# Patient Record
Sex: Male | Born: 1995 | Race: White | Hispanic: No | Marital: Single | Smoking: Current every day smoker
Health system: Southern US, Community
[De-identification: ages and names within clinical notes are randomized; demographics above are authoritative.]

## PROBLEM LIST (undated history)

## (undated) DIAGNOSIS — N35014 Post-traumatic urethral stricture, male, unspecified: Secondary | ICD-10-CM

## (undated) DIAGNOSIS — I10 Essential (primary) hypertension: Secondary | ICD-10-CM

## (undated) DIAGNOSIS — N261 Atrophy of kidney (terminal): Secondary | ICD-10-CM

## (undated) DIAGNOSIS — E669 Obesity, unspecified: Secondary | ICD-10-CM

## (undated) DIAGNOSIS — N189 Chronic kidney disease, unspecified: Secondary | ICD-10-CM

## (undated) DIAGNOSIS — N360 Urethral fistula: Secondary | ICD-10-CM

## (undated) HISTORY — PX: EPISPADIUS CORRECTION: SHX624

## (undated) HISTORY — PX: TRANSRECTAL DRAINAGE OF PELVIC ABSCESS: SUR1387

## (undated) HISTORY — PX: OTHER SURGICAL HISTORY: SHX169

## (undated) HISTORY — DX: Chronic kidney disease, unspecified: N18.9

## (undated) HISTORY — DX: Post-traumatic urethral stricture, male, unspecified: N35.014

## (undated) HISTORY — PX: ABDOMINAL SURGERY: SHX537

## (undated) HISTORY — PX: URETHRAL DILATION: SUR417

## (undated) HISTORY — DX: Atrophy of kidney (terminal): N26.1

## (undated) HISTORY — DX: Obesity, unspecified: E66.9

## (undated) HISTORY — DX: Urethral fistula: N36.0

## (undated) HISTORY — PX: CYSTOURETHROSCOPY: SHX476

---
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
## 2017-12-05 DIAGNOSIS — N181 Chronic kidney disease, stage 1: Secondary | ICD-10-CM | POA: Insufficient documentation

## 2018-02-01 ENCOUNTER — Encounter: Payer: Self-pay | Admitting: Emergency Medicine

## 2018-02-01 ENCOUNTER — Other Ambulatory Visit: Payer: Self-pay

## 2018-02-01 ENCOUNTER — Emergency Department
Admission: EM | Admit: 2018-02-01 | Discharge: 2018-02-01 | Disposition: A | Discharge status: Home / Self Care | Payer: Medicaid Other | Attending: Emergency Medicine | Admitting: Emergency Medicine

## 2018-02-01 DIAGNOSIS — J111 Influenza due to unidentified influenza virus with other respiratory manifestations: Secondary | ICD-10-CM

## 2018-02-01 DIAGNOSIS — R05 Cough: Secondary | ICD-10-CM | POA: Diagnosis present

## 2018-02-01 DIAGNOSIS — I10 Essential (primary) hypertension: Secondary | ICD-10-CM | POA: Diagnosis not present

## 2018-02-01 DIAGNOSIS — J101 Influenza due to other identified influenza virus with other respiratory manifestations: Secondary | ICD-10-CM | POA: Diagnosis not present

## 2018-02-01 LAB — INFLUENZA PANEL BY PCR (TYPE A & B)
INFLAPCR: POSITIVE — AB
INFLBPCR: NEGATIVE

## 2018-02-01 MED ORDER — SODIUM CHLORIDE 0.9 % IV BOLUS (SEPSIS)
1000.0000 mL | Freq: Once | INTRAVENOUS | Status: AC
  Administered 2018-02-01: 1000 mL via INTRAVENOUS

## 2018-02-01 NOTE — Discharge Instructions (Signed)
You are being treated for influenza A.  Drink plenty fluids.  Return to the emergency room immediately for any worsening condition including trouble breathing, passing out, confusion or altered mental status, shortness of breath, concern for dehydration such as dry mouth or not making urine, abdominal pain, or any other symptoms concerning to you.

## 2018-02-01 NOTE — ED Provider Notes (Signed)
Flu A positive.  Feels improved after fluids.  I appreciate he still tachycardic but he is asking to go home.  No evidence of pneumonia.  Declines Tamiflu.  Discharged home according to Dr. Merlene PullingLord's plan.   Merrily Brittleifenbark, Leara Rawl, MD 02/01/18 [phone]

## 2018-02-01 NOTE — ED Provider Notes (Signed)
[facility] Emergency Department Provider Note ____________________________________________   I have reviewed the triage vital signs and the triage nursing note.  HISTORY  Chief Complaint Influenza   Historian Patient  HPI Austin Stevenson is a 22 y.o. male presents with 24 hours of cough congestion, mild nausea with one episode of nonbloody nonbilious emesis, and family contacts with positive for the flu.  Positive fever at home.  He took ibuprofen.  No abdominal pain.  Symptoms are moderate.  Nothing is making it worse or better.   Past Medical History:  Diagnosis Date  . Hypertension     Patient Active Problem List   Diagnosis Date Noted  . Food impaction of esophagus     Past Surgical History:  Procedure Laterality Date  . ABDOMINAL SURGERY    . ESOPHAGOGASTRODUODENOSCOPY (EGD) WITH PROPOFOL N/A 11/25/2016   Procedure: ESOPHAGOGASTRODUODENOSCOPY (EGD) WITH PROPOFOL;  Surgeon: Midge Minium, MD;  Location: [facility] ENDOSCOPY;  Service: Endoscopy;  Laterality: N/A;    Prior to Admission medications   Medication Sig Start Date End Date Taking? Authorizing Provider  lisinopril (PRINIVIL,ZESTRIL) 10 MG tablet Take 15 tablets by mouth daily.  08/02/16  Yes [provider]    No Known Allergies  No family history on file.  Social History Social History   Tobacco Use  . Smoking status: Never Smoker  . Smokeless tobacco: Never Used  Substance Use Topics  . Alcohol use: No  . Drug use: No    Review of Systems  Constitutional: Positive for fever. Eyes: Negative for visual changes. ENT: Negative for sore throat. Cardiovascular: Negative for chest pain. Respiratory: Defer congestion without pleuritic chest pain or shortness of breath.   Gastrointestinal: Negative for abdominal pain. Genitourinary: Negative for dysuria. Musculoskeletal: Negative for back pain. Skin: Negative for rash. Neurological: Negative for  headache.  ____________________________________________   PHYSICAL EXAM:  VITAL SIGNS: ED Triage Vitals  Enc Vitals Group     BP 02/01/18 1119 (!) 159/77     Pulse Rate 02/01/18 1119 (!) 141     Resp 02/01/18 1119 20     Temp 02/01/18 1119 98.1 F (36.7 C)     Temp Source 02/01/18 1119 Oral     SpO2 02/01/18 1119 97 %     Weight 02/01/18 1120 (!) 315 lb (142.9 kg)     Height 02/01/18 1120 6\' 1"  (1.854 m)     Head Circumference --      Peak Flow --      Pain Score 02/01/18 1120 0     Pain Loc --      Pain Edu? --      Excl. in GC? --      Constitutional: Alert and oriented.  No acute distress, but does look like he feels miserable. HEENT   Head: Normocephalic and atraumatic.      Eyes: Conjunctivae are normal. Pupils equal and round.       Ears:         Nose: No congestion/rhinnorhea.   Mouth/Throat: Mucous membranes are moist.   Neck: No stridor. Cardiovascular/Chest: Tachycardic rate, regular rhythm.  No murmurs, rubs, or gallops. Respiratory: Normal respiratory effort without tachypnea nor retractions. Breath sounds are clear and equal bilaterally. No wheezes/rales/rhonchi. Gastrointestinal: Soft. No distention, no guarding, no rebound. Nontender.  Obese Genitourinary/rectal:Deferred Musculoskeletal: Nontender with normal range of motion in all extremities. No joint effusions.  No lower extremity tenderness.  No edema. Neurologic:  Normal speech and language. No gross or focal  neurologic deficits are appreciated. Skin:  Skin is warm, dry and intact. No rash noted. Psychiatric: Mood and affect are normal. Speech and behavior are normal. Patient exhibits appropriate insight and judgment.   ____________________________________________  LABS (pertinent positives/negatives) I, Governor Rooksebecca Eastyn Dattilo, MD the attending physician have reviewed the labs noted below.  Labs Reviewed  INFLUENZA PANEL BY PCR (TYPE A & B) - Abnormal; Notable for the following components:       Result Value   Influenza A By PCR POSITIVE (*)    All other components within normal limits    __________________________________________  PROCEDURES  Procedure(s) performed: None  Critical Care performed: None   ____________________________________________  ED COURSE / ASSESSMENT AND PLAN  Pertinent labs & imaging results that were available during my care of the patient were reviewed by me and considered in my medical decision making (see chart for details).   Patient has tachycardia and borderline blood pressure, I am going to give him IV fluids here in the emergency department.  Otherwise he is not hypoxic or having significant trouble breathing.  Patient found to be influenza A positive, he has had positive sick contacts.  I am been given fluid bolus here 1-2 L.  We discussed the risk and benefit of Tamiflu, and given that he is young and otherwise healthy, and chose to hold off on any use of Tamiflu at this point.  Patient care to be transferred to Dr. Lamont Snowballifenbark at shift change 3 PM.  Disposition expected to be home after fluid bolus.  CONSULTATIONS: None  Patient / Family / Caregiver informed of clinical course, medical decision-making process, and agree with plan.   I discussed return precautions, follow-up instructions, and discharge instructions with patient and/or family.  Discharge Instructions: You are being treated for influenza A.  Drink plenty fluids.  Return to the emergency room immediately for any worsening condition including trouble breathing, passing out, confusion or altered mental status, shortness of breath, concern for dehydration such as dry mouth or not making urine, abdominal pain, or any other symptoms concerning to you.  ___________________________________________   FINAL CLINICAL IMPRESSION(S) / ED DIAGNOSES   Final diagnoses:  Influenza      ___________________________________________        Note: This dictation was  prepared with Dragon dictation. Any transcriptional errors that result from this process are unintentional    Governor RooksLord, Fantasy Donald, MD 02/01/18 1424

## 2018-02-01 NOTE — ED Triage Notes (Addendum)
Pt in via POV with complaints of cough, congestion since yesterday.  Pt reports most of family at home positive for flu within the last week.  Pt reports last taking ibuprofen this morning.  Pt tachycardic, other vitals WDL, NAD noted at this time.

## 2018-03-11 DIAGNOSIS — R7401 Elevation of levels of liver transaminase levels: Secondary | ICD-10-CM | POA: Insufficient documentation

## 2018-03-11 DIAGNOSIS — R740 Nonspecific elevation of levels of transaminase and lactic acid dehydrogenase [LDH]: Secondary | ICD-10-CM

## 2018-04-06 ENCOUNTER — Emergency Department
Admission: EM | Admit: 2018-04-06 | Discharge: 2018-04-06 | Disposition: A | Discharge status: Home / Self Care | Payer: Medicaid Other | Attending: Emergency Medicine | Admitting: Emergency Medicine

## 2018-04-06 ENCOUNTER — Encounter: Payer: Self-pay | Admitting: Medical Oncology

## 2018-04-06 ENCOUNTER — Emergency Department: Payer: Medicaid Other

## 2018-04-06 DIAGNOSIS — I10 Essential (primary) hypertension: Secondary | ICD-10-CM | POA: Insufficient documentation

## 2018-04-06 DIAGNOSIS — Y998 Other external cause status: Secondary | ICD-10-CM | POA: Diagnosis not present

## 2018-04-06 DIAGNOSIS — T18128A Food in esophagus causing other injury, initial encounter: Secondary | ICD-10-CM

## 2018-04-06 DIAGNOSIS — Z79899 Other long term (current) drug therapy: Secondary | ICD-10-CM | POA: Insufficient documentation

## 2018-04-06 DIAGNOSIS — X58XXXA Exposure to other specified factors, initial encounter: Secondary | ICD-10-CM | POA: Insufficient documentation

## 2018-04-06 DIAGNOSIS — Y9389 Activity, other specified: Secondary | ICD-10-CM | POA: Insufficient documentation

## 2018-04-06 DIAGNOSIS — Y929 Unspecified place or not applicable: Secondary | ICD-10-CM | POA: Diagnosis not present

## 2018-04-06 DIAGNOSIS — K222 Esophageal obstruction: Secondary | ICD-10-CM | POA: Insufficient documentation

## 2018-04-06 DIAGNOSIS — T17228A Food in pharynx causing other injury, initial encounter: Secondary | ICD-10-CM | POA: Diagnosis present

## 2018-04-06 NOTE — ED Triage Notes (Signed)
About pta pt reports he was eating ham and it got lodged. Pt reports hx of this and had to have procedure done to get relief.

## 2018-04-06 NOTE — ED Provider Notes (Signed)
[facility] Emergency Department Provider Note   ____________________________________________    I have reviewed the triage vital signs and the nursing notes.   HISTORY  Chief Complaint Foreign Body     HPI Austin Stevenson is a 22 y.o. male who presents because he feels that he got a piece of ham stuck in his throat.  Patient reports that he was eating lunch took a bite of hand and fell to get lodged in his esophagus.  He denies difficulty breathing.  He reports this is happened several times before, once it required endoscopy.  He always feels that food gets lodged in the same spot.  He has never had this worked up.  Has not taken anything for this   Past Medical History:  Diagnosis Date  . Hypertension     Patient Active Problem List   Diagnosis Date Noted  . Food impaction of esophagus     Past Surgical History:  Procedure Laterality Date  . ABDOMINAL SURGERY    . ESOPHAGOGASTRODUODENOSCOPY (EGD) WITH PROPOFOL N/A 11/25/2016   Procedure: ESOPHAGOGASTRODUODENOSCOPY (EGD) WITH PROPOFOL;  Surgeon: Midge Minium, MD;  Location: [facility] ENDOSCOPY;  Service: Endoscopy;  Laterality: N/A;    Prior to Admission medications   Medication Sig Start Date End Date Taking? Authorizing Provider  lisinopril (PRINIVIL,ZESTRIL) 10 MG tablet Take 15 tablets by mouth daily.  08/02/16   [provider]     Allergies Patient has no known allergies.  No family history on file.  Social History Social History   Tobacco Use  . Smoking status: Never Smoker  . Smokeless tobacco: Never Used  Substance Use Topics  . Alcohol use: No  . Drug use: No    Review of Systems  Constitutional: No dizziness Eyes: No visual changes.  ENT: No sore throat. Cardiovascular: Denies chest pain. Respiratory: Denies shortness of breath. Gastrointestinal: No abdominal pain.  No nausea, no vomiting.     Neurological: Negative for  headaches   ____________________________________________   PHYSICAL EXAM:  VITAL SIGNS: ED Triage Vitals [04/06/18 1333]  Enc Vitals Group     BP (!) 133/101     Pulse Rate (!) 111     Resp 20     Temp 97.8 F (36.6 C)     Temp Source Oral     SpO2 99 %     Weight (!) 137 kg (302 lb)     Height 1.854 m (6\' 1" )     Head Circumference      Peak Flow      Pain Score 0     Pain Loc      Pain Edu?      Excl. in GC?    Constitutional: Alert and oriented. No acute distress. Pleasant and interactive    Nose: No congestion/rhinnorhea. Mouth/Throat: Mucous membranes are moist.    Cardiovascular: Normal rate, regular rhythm.   Good peripheral circulation. Respiratory: Normal respiratory effort.  No retractions.    Warm and well perfused Neurologic:  Normal speech and language. No gross focal neurologic deficits are appreciated.  Skin:  Skin is warm, dry and intact. No rash noted. Psychiatric: Mood and affect are normal. Speech and behavior are normal.  ____________________________________________   LABS (all labs ordered are listed, but only abnormal results are displayed)  Labs Reviewed - No data to display ____________________________________________  EKG  None ____________________________________________  RADIOLOGY  Soft tissue neck performed by triage ____________________________________________   PROCEDURES  Procedure(s) performed: No  Procedures   Critical Care performed: No ____________________________________________   INITIAL IMPRESSION / ASSESSMENT AND PLAN / ED COURSE  Pertinent labs & imaging results that were available during my care of the patient were reviewed by me and considered in my medical decision making (see chart for details).  Patient presents with likely esophageal food impaction, patient was able to dislodge on his own and was able to bring it up and show it to me on a paper towel, he feels better now and has no symptoms.   Discussed with him how he needs evaluation by GI as an outpatient for further evaluation of repeat esophageal food impactions    ____________________________________________   FINAL CLINICAL IMPRESSION(S) / ED DIAGNOSES  Final diagnoses:  Esophageal obstruction due to food impaction        Note:  This document was prepared using Dragon voice recognition software and may include unintentional dictation errors.    Jene EveryKinner, Laquilla Dault, MD 04/06/18 [phone]

## 2018-04-06 NOTE — ED Notes (Signed)
Patient denies pain and is resting comfortably.  

## 2018-04-06 NOTE — ED Notes (Signed)
AAOx3.  Skin warm and dry.  NAD 

## 2018-08-28 ENCOUNTER — Encounter: Payer: Self-pay | Admitting: Adult Health

## 2018-08-28 ENCOUNTER — Ambulatory Visit (INDEPENDENT_AMBULATORY_CARE_PROVIDER_SITE_OTHER): Payer: Medicaid Other | Admitting: Adult Health

## 2018-08-28 VITALS — BP 120/76 | HR 98 | Resp 16 | Ht 73.0 in | Wt 309.0 lb

## 2018-08-28 DIAGNOSIS — Z23 Encounter for immunization: Secondary | ICD-10-CM | POA: Diagnosis not present

## 2018-08-28 DIAGNOSIS — I10 Essential (primary) hypertension: Secondary | ICD-10-CM | POA: Insufficient documentation

## 2018-08-28 DIAGNOSIS — N181 Chronic kidney disease, stage 1: Secondary | ICD-10-CM

## 2018-08-28 DIAGNOSIS — I151 Hypertension secondary to other renal disorders: Secondary | ICD-10-CM | POA: Diagnosis not present

## 2018-08-28 DIAGNOSIS — Z789 Other specified health status: Secondary | ICD-10-CM

## 2018-08-28 DIAGNOSIS — N2889 Other specified disorders of kidney and ureter: Secondary | ICD-10-CM

## 2018-08-28 DIAGNOSIS — N261 Atrophy of kidney (terminal): Secondary | ICD-10-CM | POA: Insufficient documentation

## 2018-08-28 DIAGNOSIS — N360 Urethral fistula: Secondary | ICD-10-CM | POA: Insufficient documentation

## 2018-08-28 NOTE — Patient Instructions (Signed)
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
## 2018-09-11 ENCOUNTER — Encounter: Payer: Self-pay | Admitting: Adult Health

## 2018-09-11 ENCOUNTER — Ambulatory Visit: Payer: Medicaid Other | Admitting: Adult Health

## 2018-09-11 VITALS — BP 128/84 | HR 96 | Resp 16 | Ht 72.0 in | Wt 310.4 lb

## 2018-09-11 DIAGNOSIS — I151 Hypertension secondary to other renal disorders: Secondary | ICD-10-CM

## 2018-09-11 DIAGNOSIS — Z6841 Body Mass Index (BMI) 40.0 and over, adult: Secondary | ICD-10-CM

## 2018-09-11 DIAGNOSIS — I511 Rupture of chordae tendineae, not elsewhere classified: Secondary | ICD-10-CM

## 2018-09-11 DIAGNOSIS — N2889 Other specified disorders of kidney and ureter: Secondary | ICD-10-CM

## 2018-09-11 DIAGNOSIS — Z23 Encounter for immunization: Secondary | ICD-10-CM

## 2018-09-11 DIAGNOSIS — Z0001 Encounter for general adult medical examination with abnormal findings: Secondary | ICD-10-CM

## 2018-09-11 DIAGNOSIS — N181 Chronic kidney disease, stage 1: Secondary | ICD-10-CM

## 2018-09-11 NOTE — Progress Notes (Signed)
[facility] [address] [place], [place] [address]  Internal MEDICINE  Office Visit Note  Patient Name: Austin Stevenson  [id number]  [id number]  Date of Service: 09/11/2018  Chief Complaint  Patient presents with  . Annual Exam  . Hypertension     HPI Pt is here for routine health maintenance examination.  He is a 22 yo obese male.  He is pleasant and cooperative.  Denies complaints at this time.  He has a history of HTN.  He has an extensive surgical history due to childhood trauma.  He self cath's, through a urinary stoma a few times a day. He denies tobacco use.  He reports socially drinking, denies drug use.  He reports he is sexually active at times.   Current Medication: Outpatient Encounter Medications as of 09/11/2018  Medication Sig  . lisinopril (PRINIVIL,ZESTRIL) 10 MG tablet Take 15 tablets by mouth daily.   Marland Kitchen lisinopril (PRINIVIL,ZESTRIL) 5 MG tablet TAKE 1 TABLET BY MOUTH ONCE DAILY. TAKE A 5 MG AND 10 MG TABLET FOR TOTAL OF 15 MG.  . Vitamin D, Ergocalciferol, (DRISDOL) 50000 units CAPS capsule Take 50,000 Units by mouth once a week.   No facility-administered encounter medications on file as of 09/11/2018.     Surgical History: Past Surgical History:  Procedure Laterality Date  . ABDOMINAL SURGERY    . ABSCESS DRAINAGE  11/15/2005  . creation of cathetrizable stoma    . cystopic catheter placement   11/0/2010  . CYSTOURETHROSCOPY    . dilation of stricture  09/12/2008  . ESOPHAGOGASTRODUODENOSCOPY (EGD) WITH PROPOFOL N/A 11/25/2016   Procedure: ESOPHAGOGASTRODUODENOSCOPY (EGD) WITH PROPOFOL;  Surgeon: Midge Minium, MD;  Location: [facility] ENDOSCOPY;  Service: Endoscopy;  Laterality: N/A;  . MITROFANOFF PROCEDURE  09/10/2005  . SUPRAPUBIC CATHETER PLACEMENT  09/28/2005  . TRANSRECTAL DRAINAGE OF PELVIC ABSCESS    . URETHRAL DILATION      Medical History: Past Medical History:  Diagnosis Date  . Atrophic kidney   . Chronic kidney disease    stage 1  . Hypertension   . Obesity   . 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 Urethral fistula     Family History: Family History  Problem Relation Age of Onset  . Diabetes Mother   . Hypertension Paternal Grandfather      Review of Systems  Constitutional: Negative.  Negative for chills, fatigue, fever and unexpected weight change.  HENT: Negative.  Negative for congestion, postnasal drip, rhinorrhea, sinus pain, sneezing, sore throat and trouble swallowing.   Eyes: Negative for redness.  Respiratory: Negative.  Negative for cough, chest tightness and shortness of breath.   Cardiovascular: Negative.  Negative for chest pain, palpitations and leg swelling.  Gastrointestinal: Negative.  Negative for abdominal pain, constipation, diarrhea, nausea and vomiting.  Endocrine: Negative.   Genitourinary: Negative.  Negative for difficulty urinating, dysuria, flank pain and frequency.  Musculoskeletal: Negative.  Negative for arthralgias, back pain, joint swelling and neck pain.  Skin: Negative.  Negative for rash.  Allergic/Immunologic: Negative.   Neurological: Negative.  Negative for tremors and numbness.  Hematological: Negative for adenopathy. Does not bruise/bleed easily.  Psychiatric/Behavioral: Negative.  Negative for behavioral problems, sleep disturbance and suicidal ideas. The patient is not nervous/anxious.      Vital Signs: BP 128/84   Pulse 96   Resp 16   Ht 6' (1.829 m)   Wt (!) 310 lb 6.4 oz (140.8 kg)   SpO2 98%   BMI 42.10 kg/m  Physical Exam  Constitutional: He is oriented to person, place, and time. He appears well-developed and well-nourished. No distress.  HENT:  Head: Normocephalic and atraumatic.  Mouth/Throat: Oropharynx is clear and moist. No oropharyngeal exudate.  Eyes: Pupils are equal, round, and reactive to light. EOM are normal.  Neck: Normal range of motion. Neck supple. No JVD present. No tracheal deviation present. No thyromegaly  present.  Cardiovascular: Normal rate, regular rhythm and normal heart sounds. Exam reveals no gallop and no friction rub.  No murmur heard. Pulmonary/Chest: Effort normal and breath sounds normal. No respiratory distress. He has no wheezes. He has no rales. He exhibits no tenderness.  Abdominal: Soft. There is no tenderness. There is no guarding. Hernia confirmed negative in the right inguinal area and confirmed negative in the left inguinal area.  Genitourinary: Testes normal and penis normal. Cremasteric reflex is present. Right testis shows no mass and no tenderness. Left testis shows no mass and no tenderness. Circumcised.  Musculoskeletal: Normal range of motion.  Lymphadenopathy:    He has no cervical adenopathy. No inguinal adenopathy noted on the right or left side.  Neurological: He is alert and oriented to person, place, and time. No cranial nerve deficit.  Skin: Skin is warm and dry. He is not diaphoretic.  Psychiatric: He has a normal mood and affect. His behavior is normal. Judgment and thought content normal.  Nursing note and vitals reviewed.   LABS: No results found for this or any previous visit (from the past 2160 hour(s)).   Assessment/Plan: 1. Encounter for general adult medical examination with abnormal findings Up to date on PHM.  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   3. CKD (chronic kidney disease) stage 1, GFR 90 ml/min or greater Followed by [facility] nephrology.    4. Flu vaccine need - Flu Vaccine MDCK QUAD PF  5. BMI 40.0-44.9, adult (HCC) Obesity Counseling: Risk Assessment: An assessment of behavioral risk factors was made today and includes lack of exercise sedentary lifestyle, lack of portion control and poor dietary habits.  Risk Modification Advice: She was counseled on portion control  guidelines. Restricting daily caloric intake to. . The detrimental long term effects of obesity on her health and ongoing poor compliance was also discussed with the patient.   General Counseling: jeffery bachmeier understanding of the findings of todays visit and agrees with plan of treatment. I have discussed any further diagnostic evaluation that may be needed or ordered today. We also reviewed his medications today. he has been encouraged to call the office with any questions or concerns that should arise related to todays visit.   Orders Placed This Encounter  Procedures  . Flu Vaccine MDCK QUAD PF  . CBC with Differential/Platelet  . Lipid Panel With LDL/HDL Ratio  . TSH  . T4, free  . Comprehensive metabolic panel    No orders of the defined types were placed in this encounter.   Time spent: 25 Minutes   This patient was seen by Blima Ledger AGNP-C in Collaboration with Dr Lyndon Code as a part of collaborative care agreement   Lyndon Code, MD  Internal Medicine

## 2018-09-11 NOTE — Patient Instructions (Signed)

## 2018-09-17 LAB — CBC WITH DIFFERENTIAL/PLATELET
Basophils Absolute: 0 10*3/uL (ref 0.0–0.2)
Basos: 0 %
EOS (ABSOLUTE): 0.2 10*3/uL (ref 0.0–0.4)
EOS: 4 %
HEMATOCRIT: 46.3 % (ref 37.5–51.0)
Hemoglobin: 15.5 g/dL (ref 13.0–17.7)
Immature Grans (Abs): 0 10*3/uL (ref 0.0–0.1)
Immature Granulocytes: 0 %
Lymphocytes Absolute: 2.2 10*3/uL (ref 0.7–3.1)
Lymphs: 44 %
MCH: 27.9 pg (ref 26.6–33.0)
MCHC: 33.5 g/dL (ref 31.5–35.7)
MCV: 83 fL (ref 79–97)
MONOS ABS: 0.6 10*3/uL (ref 0.1–0.9)
Monocytes: 12 %
Neutrophils Absolute: 2 10*3/uL (ref 1.4–7.0)
Neutrophils: 40 %
PLATELETS: 273 10*3/uL (ref 150–450)
RBC: 5.55 x10E6/uL (ref 4.14–5.80)
RDW: 13.6 % (ref 12.3–15.4)
WBC: 5 10*3/uL (ref 3.4–10.8)

## 2018-09-17 LAB — COMPREHENSIVE METABOLIC PANEL
ALBUMIN: 4.5 g/dL (ref 3.5–5.5)
ALT: 46 IU/L — ABNORMAL HIGH (ref 0–44)
AST: 32 IU/L (ref 0–40)
Albumin/Globulin Ratio: 1.5 (ref 1.2–2.2)
Alkaline Phosphatase: 92 IU/L (ref 39–117)
BUN / CREAT RATIO: 12 (ref 9–20)
BUN: 16 mg/dL (ref 6–20)
Bilirubin Total: 0.8 mg/dL (ref 0.0–1.2)
CALCIUM: 9.7 mg/dL (ref 8.7–10.2)
CO2: 23 mmol/L (ref 20–29)
CREATININE: 1.29 mg/dL — AB (ref 0.76–1.27)
Chloride: 103 mmol/L (ref 96–106)
GFR calc Af Amer: 92 mL/min/{1.73_m2} (ref >59)
GFR, EST NON AFRICAN AMERICAN: 79 mL/min/{1.73_m2} (ref >59)
GLOBULIN, TOTAL: 3.1 g/dL (ref 1.5–4.5)
Glucose: 89 mg/dL (ref 65–99)
Potassium: 4.8 mmol/L (ref 3.5–5.2)
SODIUM: 140 mmol/L (ref 134–144)
TOTAL PROTEIN: 7.6 g/dL (ref 6.0–8.5)

## 2018-09-17 LAB — LIPID PANEL WITH LDL/HDL RATIO
CHOLESTEROL TOTAL: 156 mg/dL (ref 100–199)
HDL: 42 mg/dL (ref >39)
LDL Calculated: 95 mg/dL (ref 0–99)
LDl/HDL Ratio: 2.3 ratio (ref 0.0–3.6)
TRIGLYCERIDES: 94 mg/dL (ref 0–149)
VLDL Cholesterol Cal: 19 mg/dL (ref 5–40)

## 2018-09-17 LAB — TSH: TSH: 2.09 u[IU]/mL (ref 0.450–4.500)

## 2018-09-17 LAB — T4, FREE: Free T4: 1.46 ng/dL (ref 0.82–1.77)

## 2018-10-02 ENCOUNTER — Ambulatory Visit: Payer: Self-pay | Admitting: Adult Health

## 2018-11-02 ENCOUNTER — Telehealth: Payer: Self-pay

## 2018-11-02 NOTE — Telephone Encounter (Signed)
Pt called wanting a copy of his flu vaccination, called pt to inform him that his paperwork is ready for pickup.

## 2019-05-06 IMAGING — CR DG NECK SOFT TISSUE
2 series · 2 of 2 positions shown · non-contrast
Comparison: None.

CLINICAL DATA: Food stuck in throat.

EXAM:
NECK SOFT TISSUES - 1+ VIEW

[neck lat]
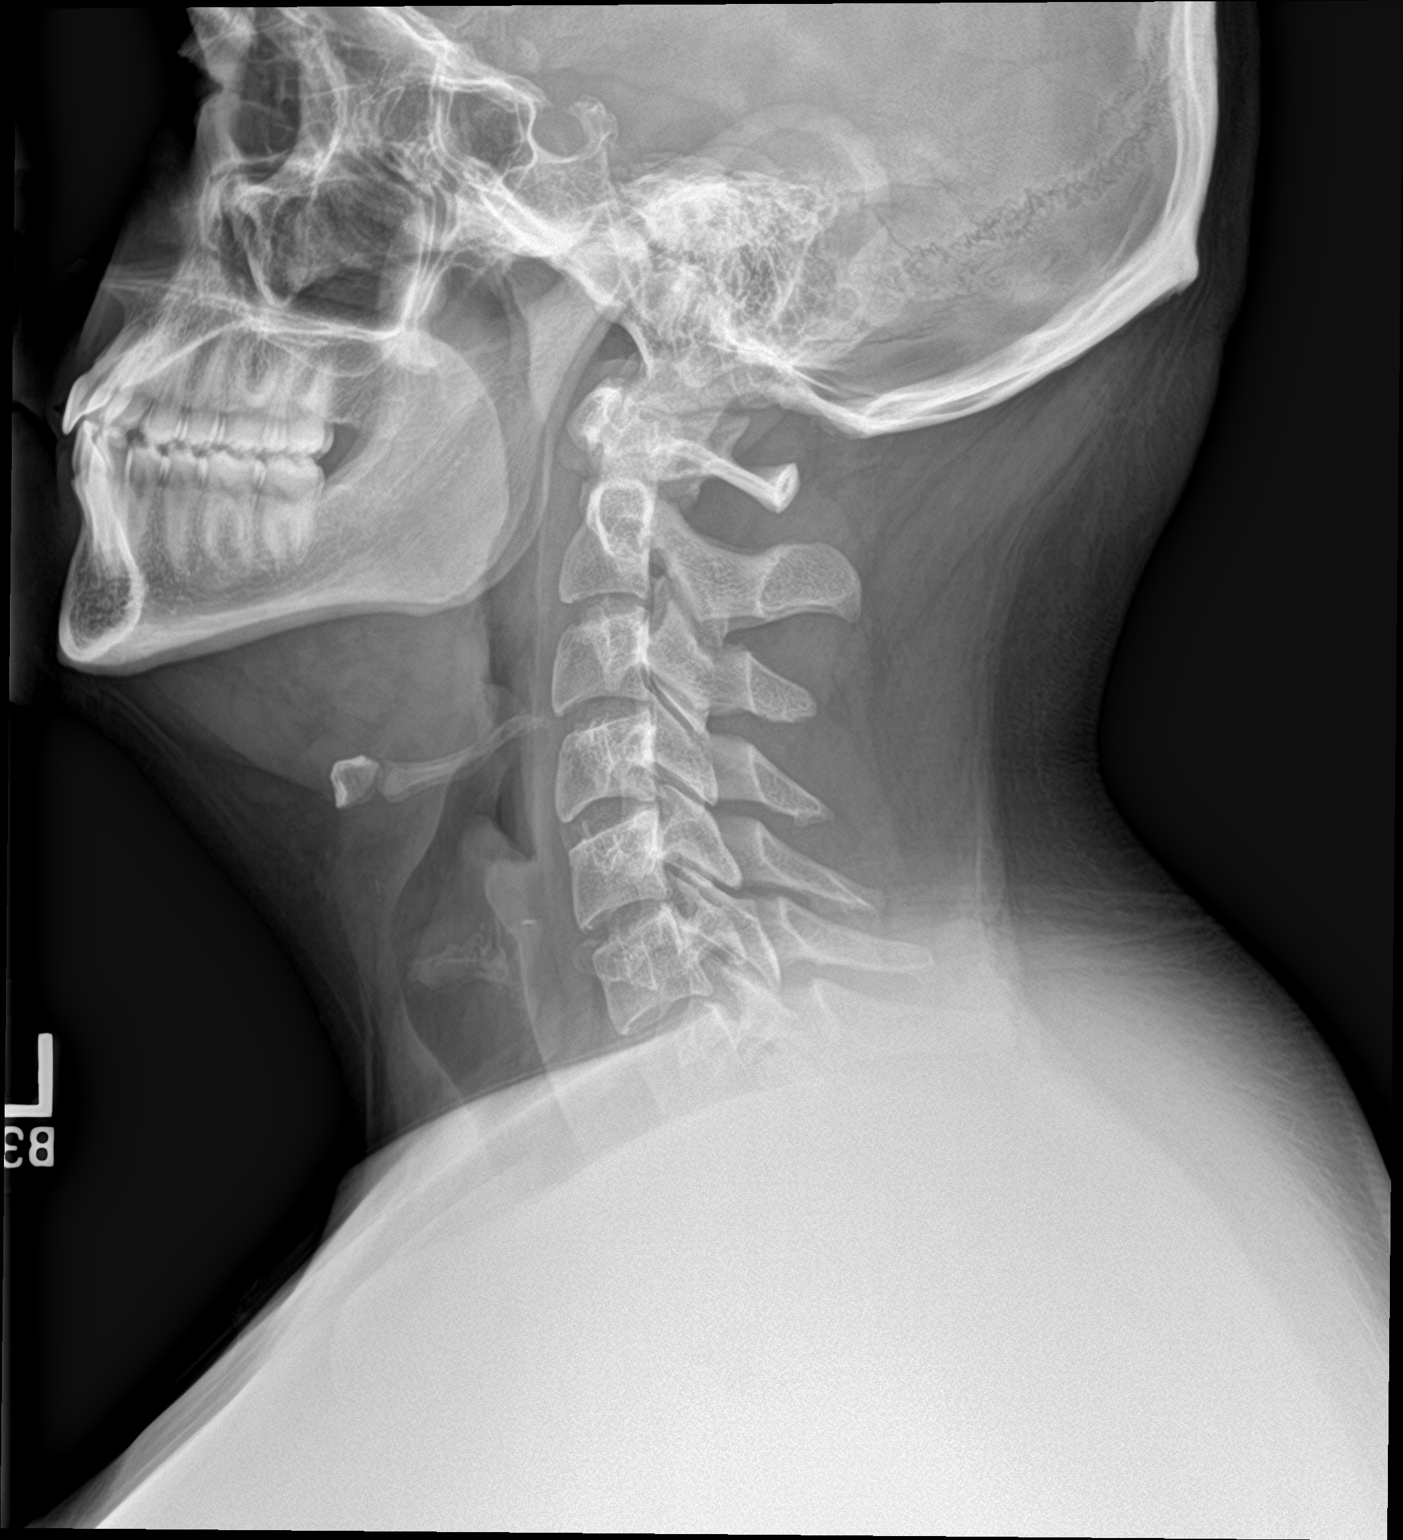

[neck ap]
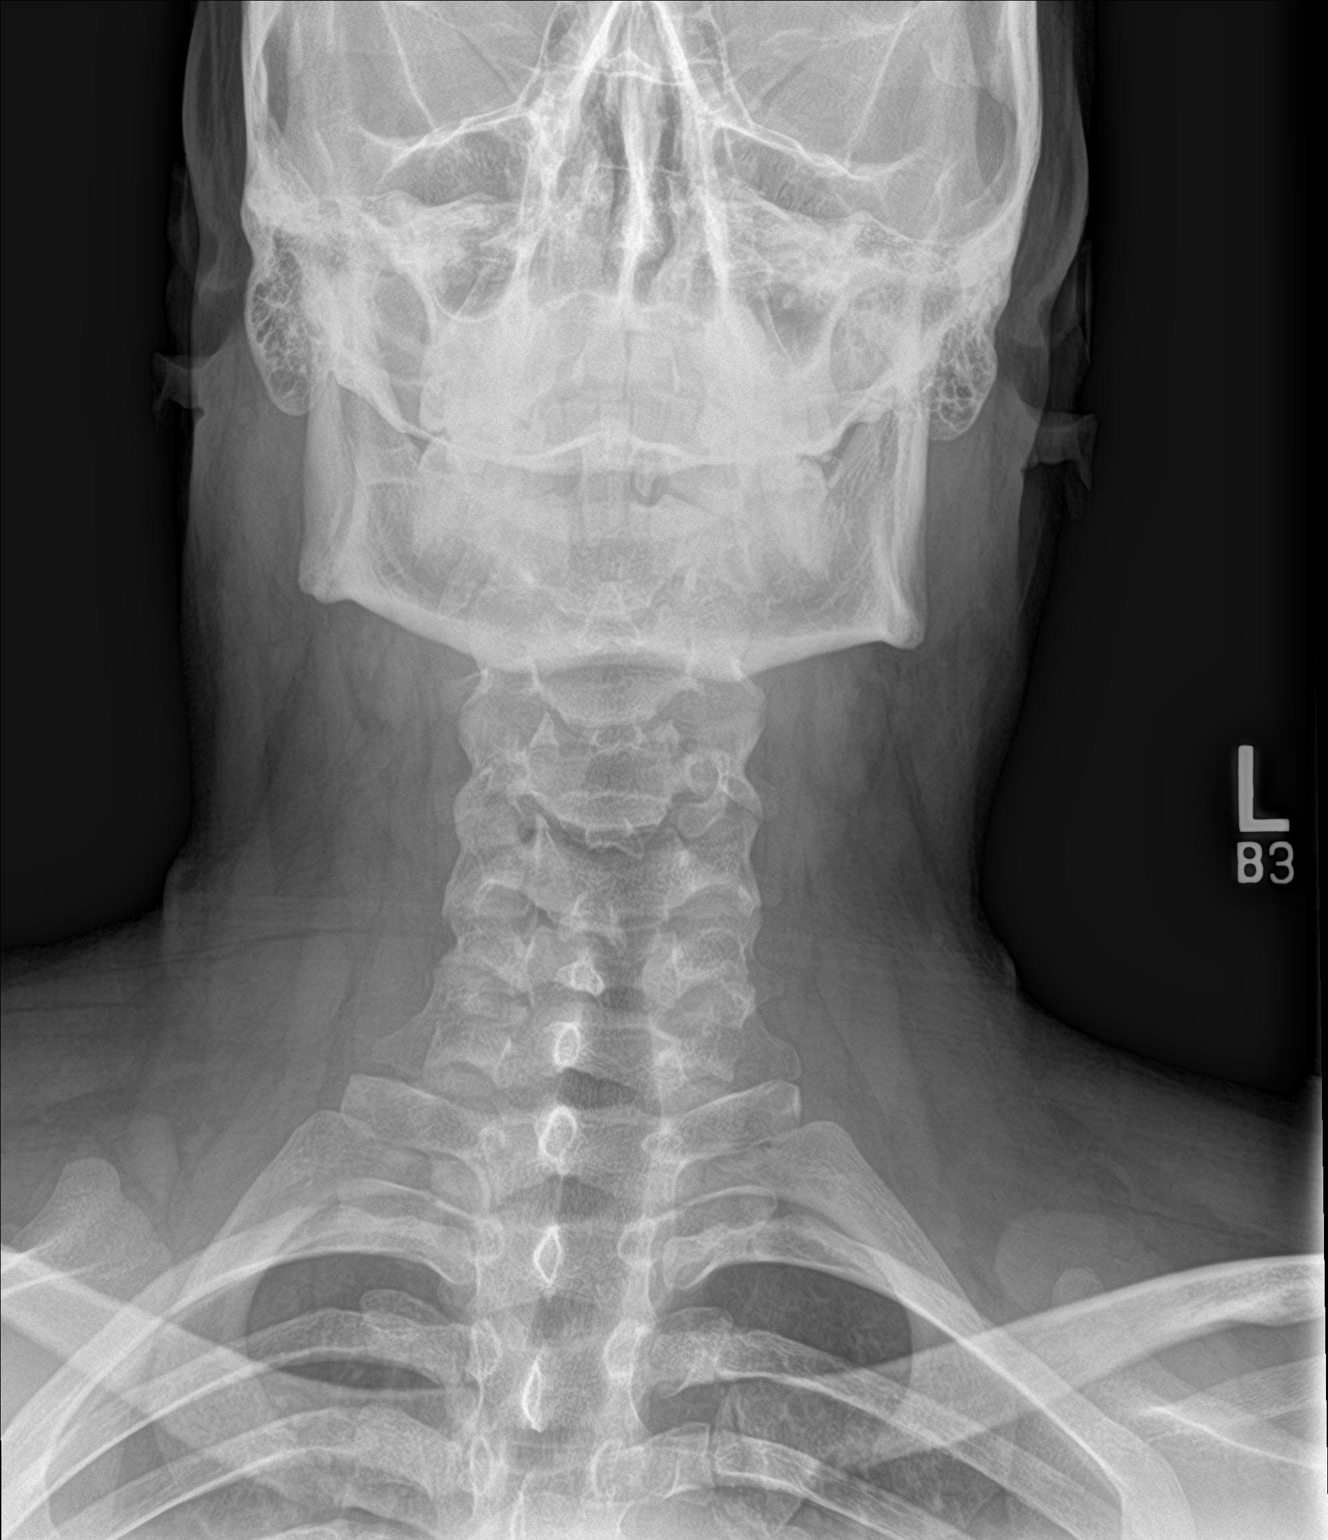

[2 of 2 positions shown; findings below may reference images not displayed]

FINDINGS: There is no evidence of retropharyngeal soft tissue swelling or
epiglottic enlargement. In the lateral projection, a thin radiopaque
density is identified measuring approximately 3 mm in length and
located near the base of the epiglottis. This could conceivably be
within the origin of the esophagus anteriorly. This could also
relate to some type of asymmetric cartilaginous density. The
cervical airway is normally patent.
IMPRESSION: Focal radiopaque density measuring approximately 3 mm in length
located near the base of the epiglottis and anterior esophageal
origin. This could conceivably be within the upper anterior
esophagus. This may also relate to asymmetric cartilaginous density.

## 2019-05-20 ENCOUNTER — Encounter: Payer: Self-pay | Admitting: Nurse Practitioner

## 2019-05-20 ENCOUNTER — Ambulatory Visit (INDEPENDENT_AMBULATORY_CARE_PROVIDER_SITE_OTHER): Payer: Medicaid Other | Admitting: Nurse Practitioner

## 2019-05-20 ENCOUNTER — Other Ambulatory Visit: Payer: Self-pay

## 2019-05-20 VITALS — BP 123/67 | HR 147 | Temp 101.7°F | Resp 16 | Ht 72.0 in | Wt 310.0 lb

## 2019-05-20 DIAGNOSIS — R109 Unspecified abdominal pain: Secondary | ICD-10-CM

## 2019-05-20 DIAGNOSIS — R319 Hematuria, unspecified: Secondary | ICD-10-CM

## 2019-05-20 DIAGNOSIS — N390 Urinary tract infection, site not specified: Secondary | ICD-10-CM | POA: Diagnosis not present

## 2019-05-20 DIAGNOSIS — R300 Dysuria: Secondary | ICD-10-CM | POA: Diagnosis not present

## 2019-05-20 LAB — POCT URINALYSIS DIPSTICK
Bilirubin, UA: NEGATIVE
Glucose, UA: NEGATIVE
Ketones, UA: NEGATIVE
Nitrite, UA: POSITIVE
Protein, UA: POSITIVE — AB
Spec Grav, UA: 1.01 (ref 1.010–1.025)
Urobilinogen, UA: 0.2 E.U./dL
pH, UA: 6 (ref 5.0–8.0)

## 2019-05-20 MED ORDER — ACETAMINOPHEN-CODEINE 300-30 MG PO TABS: 1.0000 | ORAL_TABLET | Freq: Four times a day (QID) | ORAL | 0 refills | Status: DC | PRN

## 2019-05-20 MED ORDER — CIPROFLOXACIN HCL 500 MG PO TABS: 500.0000 mg | ORAL_TABLET | Freq: Two times a day (BID) | ORAL | 0 refills | Status: DC

## 2019-05-20 NOTE — Progress Notes (Signed)
[facility] [address] [place], [place] [address]  Internal MEDICINE  Office Visit Note  Patient Name: Austin GarbeJeremy D Stevenson  [birth year]  [id number]  Date of Service: 05/26/2019   Pt is here for a sick visit.  Chief Complaint  Patient presents with  . Flank Pain    lower back pain  . Chronic Kidney Disease  . Vomiting  . Fever     The patient is here for sick visit. Has severe back and flank pain. He has fever and abdominal discomfort. Feels as though he has a kidney infection. States that he has had several, has even gone septic a few times. He states that he was unable to sleep last night as pain was so severe. He denies urinary frequency or dysuria.        Current Medication:  Outpatient Encounter Medications as of 05/20/2019  Medication Sig  . lisinopril (PRINIVIL,ZESTRIL) 10 MG tablet Take 15 tablets by mouth daily.   Marland Kitchen. lisinopril (PRINIVIL,ZESTRIL) 5 MG tablet TAKE 1 TABLET BY MOUTH ONCE DAILY. TAKE A 5 MG AND 10 MG TABLET FOR TOTAL OF 15 MG.  . Vitamin D, Ergocalciferol, (DRISDOL) 50000 units CAPS capsule Take 50,000 Units by mouth once a week.  . Acetaminophen-Codeine 300-30 MG tablet Take 1 tablet by mouth every 6 (six) hours as needed for pain.  . ciprofloxacin (CIPRO) 500 MG tablet Take 1 tablet (500 mg total) by mouth 2 (two) times daily.   No facility-administered encounter medications on file as of 05/20/2019.       Medical History: Past Medical History:  Diagnosis Date  . Atrophic kidney   . Chronic kidney disease    stage 1  . Hypertension   . Obesity   . 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 Urethral fistula      Today's Vitals   05/20/19 0949  BP: 123/67  Pulse: (!) 147  Resp: 16  Temp: (!) 101.7 F (38.7 C)  SpO2: 95%  Weight: (!) 310 lb (140.6 kg)  Height: 6' (1.829 m)   Body mass index is 42.04 kg/m.  Review of Systems  Constitutional: Positive for activity change, appetite change, chills, fatigue and fever.  Negative for unexpected weight change.  HENT: Negative for congestion, postnasal drip, rhinorrhea, sneezing and sore throat.   Respiratory: Negative for cough, chest tightness, shortness of breath and wheezing.   Cardiovascular: Negative for chest pain and palpitations.  Gastrointestinal: Positive for abdominal pain, nausea and vomiting. Negative for constipation and diarrhea.  Genitourinary: Positive for dysuria, flank pain, frequency, hematuria and urgency.  Musculoskeletal: Positive for back pain. Negative for arthralgias, joint swelling and neck pain.  Skin: Negative for rash.  Neurological: Positive for headaches. Negative for tremors and numbness.  Hematological: Negative for adenopathy. Does not bruise/bleed easily.  Psychiatric/Behavioral: Negative for behavioral problems (Depression), sleep disturbance and suicidal ideas. The patient is not nervous/anxious.     Physical Exam Vitals signs and nursing note reviewed.  Constitutional:      General: He is in acute distress.     Appearance: He is well-developed. He is ill-appearing. He is not diaphoretic.  HENT:     Head: Normocephalic and atraumatic.     Mouth/Throat:     Pharynx: No oropharyngeal exudate.  Eyes:     Pupils: Pupils are equal, round, and reactive to light.  Neck:     Musculoskeletal: Normal range of motion and neck supple.     Thyroid: No thyromegaly.     Vascular:  No JVD.     Trachea: No tracheal deviation.  Cardiovascular:     Rate and Rhythm: Normal rate and regular rhythm.     Heart sounds: Normal heart sounds. No murmur. No friction rub. No gallop.   Pulmonary:     Effort: Pulmonary effort is normal. No respiratory distress.     Breath sounds: Normal breath sounds. No wheezing or rales.  Chest:     Chest wall: No tenderness.  Abdominal:     General: Bowel sounds are normal.     Palpations: Abdomen is soft.     Tenderness: There is abdominal tenderness.  Genitourinary:    Comments: Urine sample is  positive for large WBC and blood. Also positive for nitrites and protein.  Musculoskeletal: Normal range of motion.  Lymphadenopathy:     Cervical: No cervical adenopathy.  Skin:    General: Skin is warm and dry.  Neurological:     Mental Status: He is alert and oriented to person, place, and time.     Cranial Nerves: No cranial nerve deficit.  Psychiatric:        Behavior: Behavior normal.        Thought Content: Thought content normal.        Judgment: Judgment normal.    Assessment/Plan: 1. Urinary tract infection with hematuria, site unspecified Start cipro 500mg  bid for 10 days. Send urine for culture and sensitivity and adjust abx as indicated.  - CULTURE, URINE COMPREHENSIVE - ciprofloxacin (CIPRO) 500 MG tablet; Take 1 tablet (500 mg total) by mouth 2 (two) times daily.  Dispense: 20 tablet; Refill: 0  2. Flank pain, acute Short term prescription for Tylenol #3 given today. May be taken every 4 to 6 hours as needed for pain. Advised he use with caution as it may cause dizziness and drowsiness.  - Acetaminophen-Codeine 300-30 MG tablet; Take 1 tablet by mouth every 6 (six) hours as needed for pain.  Dispense: 20 tablet; Refill: 0  3. Dysuria Treat for infection.  - POCT Urinalysis Dipstick  General Counseling: Garey verbalizes understanding of the findings of todays visit and agrees with plan of treatment. I have discussed any further diagnostic evaluation that may be needed or ordered today. We also reviewed his medications today. he has been encouraged to call the office with any questions or concerns that should arise related to todays visit.    Counseling:  This patient was seen by Vincent Gros FNP Collaboration with Dr Lyndon Code as a part of collaborative care agreement  Orders Placed This Encounter  Procedures  . CULTURE, URINE COMPREHENSIVE  . POCT Urinalysis Dipstick    Meds ordered this encounter  Medications  . ciprofloxacin (CIPRO) 500 MG tablet     Sig: Take 1 tablet (500 mg total) by mouth 2 (two) times daily.    Dispense:  20 tablet    Refill:  0    Order Specific Question:   Supervising Provider    Answer:   Lyndon Code [[id number]]  . Acetaminophen-Codeine 300-30 MG tablet    Sig: Take 1 tablet by mouth every 6 (six) hours as needed for pain.    Dispense:  20 tablet    Refill:  0    Order Specific Question:   Supervising Provider    Answer:   Lyndon Code [[id number]]    Time spent: 25 Minutes

## 2019-05-23 LAB — CULTURE, URINE COMPREHENSIVE

## 2019-05-26 DIAGNOSIS — R109 Unspecified abdominal pain: Secondary | ICD-10-CM | POA: Insufficient documentation

## 2019-05-26 DIAGNOSIS — N390 Urinary tract infection, site not specified: Secondary | ICD-10-CM | POA: Insufficient documentation

## 2019-05-26 DIAGNOSIS — R300 Dysuria: Secondary | ICD-10-CM | POA: Insufficient documentation

## 2019-05-31 ENCOUNTER — Encounter: Payer: Self-pay | Admitting: Adult Health

## 2019-05-31 ENCOUNTER — Ambulatory Visit (INDEPENDENT_AMBULATORY_CARE_PROVIDER_SITE_OTHER): Payer: Medicaid Other | Admitting: Adult Health

## 2019-05-31 ENCOUNTER — Other Ambulatory Visit: Payer: Self-pay

## 2019-05-31 VITALS — BP 132/90 | HR 93 | Resp 16 | Ht 72.0 in | Wt 320.0 lb

## 2019-05-31 DIAGNOSIS — N2889 Other specified disorders of kidney and ureter: Secondary | ICD-10-CM

## 2019-05-31 DIAGNOSIS — I151 Hypertension secondary to other renal disorders: Secondary | ICD-10-CM | POA: Diagnosis not present

## 2019-05-31 DIAGNOSIS — N3001 Acute cystitis with hematuria: Secondary | ICD-10-CM

## 2019-05-31 DIAGNOSIS — Z6841 Body Mass Index (BMI) 40.0 and over, adult: Secondary | ICD-10-CM

## 2019-05-31 DIAGNOSIS — R300 Dysuria: Secondary | ICD-10-CM

## 2019-05-31 LAB — POCT URINALYSIS DIPSTICK
Bilirubin, UA: NEGATIVE
Glucose, UA: NEGATIVE
Ketones, UA: NEGATIVE
Leukocytes, UA: NEGATIVE
Nitrite, UA: NEGATIVE
Protein, UA: POSITIVE — AB
Spec Grav, UA: 1.02 (ref 1.010–1.025)
Urobilinogen, UA: 0.2 E.U./dL
pH, UA: 5 (ref 5.0–8.0)

## 2019-05-31 NOTE — Progress Notes (Signed)
[facility] [address] [place], [place] [address]  Internal MEDICINE  Office Visit Note  Patient Name: Austin GarbeJeremy D Stevenson  [birth year]  [id number]  Date of Service: 05/31/2019  Chief Complaint  Patient presents with  . Urinary Tract Infection    10 day follow up , doing better , pulse is high , bp elevated     HPI  Pt is here for 10 day follow up from UTI.  Pt reports his symptoms have improved.  He no longer has kidney pain,  Or fatigue.  His urine has returned to normal color.  On dip today he still has a large amount of blood, and some protein. He completed his course of cipro.     Current Medication: Outpatient Encounter Medications as of 05/31/2019  Medication Sig  . lisinopril (PRINIVIL,ZESTRIL) 10 MG tablet Take 15 tablets by mouth daily.   Marland Kitchen. lisinopril (PRINIVIL,ZESTRIL) 5 MG tablet TAKE 1 TABLET BY MOUTH ONCE DAILY. TAKE A 5 MG AND 10 MG TABLET FOR TOTAL OF 15 MG.  . [DISCONTINUED] Acetaminophen-Codeine 300-30 MG tablet Take 1 tablet by mouth every 6 (six) hours as needed for pain. (Patient not taking: Reported on 05/31/2019)  . [DISCONTINUED] ciprofloxacin (CIPRO) 500 MG tablet Take 1 tablet (500 mg total) by mouth 2 (two) times daily. (Patient not taking: Reported on 05/31/2019)  . [DISCONTINUED] Vitamin D, Ergocalciferol, (DRISDOL) 50000 units CAPS capsule Take 50,000 Units by mouth once a week.   No facility-administered encounter medications on file as of 05/31/2019.     Surgical History: Past Surgical History:  Procedure Laterality Date  . ABDOMINAL SURGERY    . ABSCESS DRAINAGE  11/15/2005  . creation of cathetrizable stoma    . cystopic catheter placement   11/0/2010  . CYSTOURETHROSCOPY    . dilation of stricture  09/12/2008  . ESOPHAGOGASTRODUODENOSCOPY (EGD) WITH PROPOFOL N/A 11/25/2016   Procedure: ESOPHAGOGASTRODUODENOSCOPY (EGD) WITH PROPOFOL;  Surgeon: Midge Miniumarren Wohl, MD;  Location: [facility] ENDOSCOPY;  Service: Endoscopy;  Laterality: N/A;  .  MITROFANOFF PROCEDURE  09/10/2005  . SUPRAPUBIC CATHETER PLACEMENT  09/28/2005  . TRANSRECTAL DRAINAGE OF PELVIC ABSCESS    . URETHRAL DILATION      Medical History: Past Medical History:  Diagnosis Date  . Atrophic kidney   . Chronic kidney disease    stage 1  . Hypertension   . Obesity   . 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 Urethral fistula     Family History: Family History  Problem Relation Age of Onset  . Diabetes Mother   . Hypertension Paternal Grandfather     Social History   Socioeconomic History  . Marital status: Single    Spouse name: Not on file  . Number of children: Not on file  . Years of education: Not on file  . Highest education level: Not on file  Occupational History  . Not on file  Social Needs  . Financial resource strain: Not on file  . Food insecurity    Worry: Not on file    Inability: Not on file  . Transportation needs    Medical: Not on file    Non-medical: Not on file  Tobacco Use  . Smoking status: Former Smoker    Types: Cigars  . Smokeless tobacco: Never Used  Substance and Sexual Activity  . Alcohol use: No  . Drug use: No  . Sexual activity: Yes  Lifestyle  . Physical activity    Days per week: Not on file  Minutes per session: Not on file  . Stress: Not on file  Relationships  . Social Musicianconnections    Talks on phone: Not on file    Gets together: Not on file    Attends religious service: Not on file    Active member of club or organization: Not on file    Attends meetings of clubs or organizations: Not on file    Relationship status: Not on file  . Intimate partner violence    Fear of current or ex partner: Not on file    Emotionally abused: Not on file    Physically abused: Not on file    Forced sexual activity: Not on file  Other Topics Concern  . Not on file  Social History Narrative  . Not on file      Review of Systems  Constitutional: Negative.  Negative for chills, fatigue and unexpected  weight change.  HENT: Negative.  Negative for congestion, rhinorrhea, sneezing and sore throat.   Eyes: Negative for redness.  Respiratory: Negative.  Negative for cough, chest tightness and shortness of breath.   Cardiovascular: Negative.  Negative for chest pain and palpitations.  Gastrointestinal: Negative.  Negative for abdominal pain, constipation, diarrhea, nausea and vomiting.  Endocrine: Negative.   Genitourinary: Negative.  Negative for dysuria and frequency.  Musculoskeletal: Negative.  Negative for arthralgias, back pain, joint swelling and neck pain.  Skin: Negative.  Negative for rash.  Allergic/Immunologic: Negative.   Neurological: Negative.  Negative for tremors and numbness.  Hematological: Negative for adenopathy. Does not bruise/bleed easily.  Psychiatric/Behavioral: Negative.  Negative for behavioral problems, sleep disturbance and suicidal ideas. The patient is not nervous/anxious.     Vital Signs: BP 132/90   Pulse 93   Resp 16   Ht 6' (1.829 m)   Wt (!) 320 lb (145.2 kg)   SpO2 95%   BMI 43.40 kg/m    Physical Exam Vitals signs and nursing note reviewed.  Constitutional:      General: He is not in acute distress.    Appearance: He is well-developed. He is not diaphoretic.  HENT:     Head: Normocephalic and atraumatic.     Mouth/Throat:     Pharynx: No oropharyngeal exudate.  Eyes:     Pupils: Pupils are equal, round, and reactive to light.  Neck:     Musculoskeletal: Normal range of motion and neck supple.     Thyroid: No thyromegaly.     Vascular: No JVD.     Trachea: No tracheal deviation.  Cardiovascular:     Rate and Rhythm: Normal rate and regular rhythm.     Heart sounds: Normal heart sounds. No murmur. No friction rub. No gallop.   Pulmonary:     Effort: Pulmonary effort is normal. No respiratory distress.     Breath sounds: Normal breath sounds. No wheezing or rales.  Chest:     Chest wall: No tenderness.  Abdominal:     Palpations:  Abdomen is soft.     Tenderness: There is no abdominal tenderness. There is no guarding.  Musculoskeletal: Normal range of motion.  Lymphadenopathy:     Cervical: No cervical adenopathy.  Skin:    General: Skin is warm and dry.  Neurological:     Mental Status: He is alert and oriented to person, place, and time.     Cranial Nerves: No cranial nerve deficit.  Psychiatric:        Behavior: Behavior normal.  Thought Content: Thought content normal.        Judgment: Judgment normal.     Assessment/Plan: 1. 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  Encouraged compliance with bp meds at this time.   3. Dysuria Large RBC, and protein - POCT Urinalysis Dipstick  4. BMI 40.0-44.9, adult (HCC) Obesity Counseling: Risk Assessment: An assessment of behavioral risk factors was made today and includes lack of exercise sedentary lifestyle, lack of portion control and poor dietary habits.  Risk Modification Advice: She was counseled on portion control guidelines. Restricting daily caloric intake to. . The detrimental long term effects of obesity on her health and ongoing poor compliance was also discussed with the patient.   General Counseling: naasir carreira understanding of the findings of todays visit and agrees with plan of treatment. I have discussed any further diagnostic evaluation that may be needed or ordered today. We also reviewed his medications today. he has been encouraged to call the office with any questions or concerns that should arise related to todays visit.    Orders Placed This Encounter  Procedures  . CULTURE, URINE COMPREHENSIVE  . POCT Urinalysis Dipstick    No orders of the defined types were  placed in this encounter.   Time spent: 20 Minutes   This patient was seen by Austin Stevenson AGNP-C in Collaboration with Dr Lavera Guise as a part of collaborative care agreement     Kendell Bane AGNP-C Internal medicine

## 2019-06-03 LAB — CULTURE, URINE COMPREHENSIVE

## 2019-09-07 ENCOUNTER — Ambulatory Visit: Payer: Medicaid Other | Admitting: Nurse Practitioner

## 2019-09-08 ENCOUNTER — Encounter: Payer: Self-pay | Admitting: Adult Health

## 2019-09-08 ENCOUNTER — Other Ambulatory Visit: Payer: Self-pay

## 2019-09-08 ENCOUNTER — Ambulatory Visit: Payer: Medicaid Other | Admitting: Adult Health

## 2019-09-08 VITALS — BP 128/101 | HR 132 | Temp 98.8°F | Resp 16 | Ht 73.0 in | Wt 316.0 lb

## 2019-09-08 DIAGNOSIS — R300 Dysuria: Secondary | ICD-10-CM

## 2019-09-08 DIAGNOSIS — I151 Hypertension secondary to other renal disorders: Secondary | ICD-10-CM

## 2019-09-08 DIAGNOSIS — Z6841 Body Mass Index (BMI) 40.0 and over, adult: Secondary | ICD-10-CM | POA: Diagnosis not present

## 2019-09-08 DIAGNOSIS — N2889 Other specified disorders of kidney and ureter: Secondary | ICD-10-CM

## 2019-09-08 DIAGNOSIS — N3001 Acute cystitis with hematuria: Secondary | ICD-10-CM

## 2019-09-08 LAB — POCT URINALYSIS DIPSTICK
Bilirubin, UA: NEGATIVE
Glucose, UA: NEGATIVE
Ketones, UA: POSITIVE
Nitrite, UA: POSITIVE
Protein, UA: POSITIVE — AB
Spec Grav, UA: 1.015 (ref 1.010–1.025)
Urobilinogen, UA: 0.2 E.U./dL
pH, UA: 6 (ref 5.0–8.0)

## 2019-09-08 MED ORDER — LISINOPRIL 10 MG PO TABS: 10.0000 mg | ORAL_TABLET | Freq: Every day | ORAL | 11 refills | Status: DC

## 2019-09-08 MED ORDER — AMOXICILLIN-POT CLAVULANATE 875-125 MG PO TABS: 1.0000 | ORAL_TABLET | Freq: Two times a day (BID) | ORAL | 0 refills | Status: DC

## 2019-09-08 MED ORDER — LISINOPRIL 5 MG PO TABS: ORAL_TABLET | ORAL | 11 refills | Status: DC

## 2019-09-08 NOTE — Progress Notes (Signed)
[facility] [place], [place] [address]  Internal MEDICINE  Office Visit Note  Patient Name: Austin Stevenson  [id number]  [id number]  Date of Service: 09/08/2019  Chief Complaint  Patient presents with  . Back Pain    lower back pain , odor to urine, cloudy   . Hypertension    pt has not been taking medication out of refills and bp and pulse is high     HPI Pt is here for a sick visit. Patient here complaining of kidney infection. Bilateral lower back pain, cloudy and foul smelling urine. Symptoms have been present for 1 week, Monday symptoms were much worse than today. Denies feeling like he has been running a fever, afebrile at today's visit. Denies nausea, vomiting and diarrhea. 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 Blood pressure and heart rate elevated today, reports non-compliance with lisinopril. Has not taken medication in several months.   Current Medication:  Outpatient Encounter Medications as of 09/08/2019  Medication Sig  . lisinopril (PRINIVIL,ZESTRIL) 10 MG tablet Take 15 tablets by mouth daily.   Marland Kitchen lisinopril (PRINIVIL,ZESTRIL) 5 MG tablet TAKE 1 TABLET BY MOUTH ONCE DAILY. TAKE A 5 MG AND 10 MG TABLET FOR TOTAL OF 15 MG.   No facility-administered encounter medications on file as of 09/08/2019.       Medical History: Past Medical History:  Diagnosis Date  . Atrophic kidney   . Chronic kidney disease    stage 1  . Hypertension   . Obesity   . 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 Urethral fistula      Vital Signs: BP (!) 128/101   Pulse (!) 132   Temp 98.8 F (37.1 C)   Resp 16   Ht 6\' 1"  (1.854 m)   Wt (!) 316 lb (143.3 kg)   SpO2 98%   BMI 41.69 kg/m    Review of Systems  Constitutional: Negative.  Negative for chills, fatigue and unexpected weight change.  HENT: Negative.  Negative for congestion, rhinorrhea, sneezing and sore throat.   Eyes: Negative for redness.  Respiratory:  Negative.  Negative for cough, chest tightness and shortness of breath.   Cardiovascular: Negative.  Negative for chest pain and palpitations.  Gastrointestinal: Negative.  Negative for abdominal pain, constipation, diarrhea, nausea and vomiting.  Endocrine: Negative.   Genitourinary: Positive for flank pain. Negative for dysuria and frequency.       Cloudy and malodorous urine, self-catheterizes 4X daily  Musculoskeletal: Negative for arthralgias, back pain, joint swelling and neck pain.  Skin: Negative.  Negative for rash.  Allergic/Immunologic: Negative.   Neurological: Negative.  Negative for tremors and numbness.  Hematological: Negative for adenopathy. Does not bruise/bleed easily.  Psychiatric/Behavioral: Negative.  Negative for behavioral problems, sleep disturbance and suicidal ideas. The patient is not nervous/anxious.     Physical Exam Vitals signs and nursing note reviewed.  Constitutional:      General: He is not in acute distress.    Appearance: He is well-developed. He is not diaphoretic.  HENT:     Head: Normocephalic and atraumatic.     Mouth/Throat:     Pharynx: No oropharyngeal exudate.  Eyes:     Pupils: Pupils are equal, round, and reactive to light.  Neck:     Musculoskeletal: Normal range of motion and neck supple.     Thyroid: No thyromegaly.     Vascular: No JVD.     Trachea: No tracheal deviation.  Cardiovascular:     Rate and Rhythm: Normal rate and regular rhythm.     Heart sounds: Normal heart sounds. No murmur. No friction rub. No gallop.   Pulmonary:     Effort: Pulmonary effort is normal. No respiratory distress.     Breath sounds: Normal breath sounds. No wheezing or rales.  Chest:     Chest wall: No tenderness.  Abdominal:     Palpations: Abdomen is soft.     Tenderness: There is no abdominal tenderness. There is no guarding.  Musculoskeletal: Normal range of motion.  Lymphadenopathy:     Cervical: No cervical adenopathy.  Skin:     General: Skin is warm and dry.  Neurological:     Mental Status: He is alert and oriented to person, place, and time.     Cranial Nerves: No cranial nerve deficit.  Psychiatric:        Behavior: Behavior normal.        Thought Content: Thought content normal.        Judgment: Judgment normal.    Assessment/Plan: 1. Acute cystitis with hematuria Advised patient to take entire course of antibiotics as prescribed with food. Pt should return to clinic in 7-10 days if symptoms fail to improve or new symptoms develop.  - CULTURE, URINE COMPREHENSIVE - amoxicillin-clavulanate (AUGMENTIN) 875-125 MG tablet; Take 1 tablet by mouth 2 (two) times daily.  Dispense: 20 tablet; Refill: 0  2. Hypertension secondary to other renal disorders We had a long discussion about medication compliance and the importance of maintaining control of blood pressure.   - lisinopril (ZESTRIL) 5 MG tablet; TAKE 1 TABLET BY MOUTH ONCE DAILY. TAKE A 5 MG AND 10 MG TABLET FOR TOTAL OF 15 MG.  Dispense: 30 tablet; Refill: 11 - lisinopril (ZESTRIL) 10 MG tablet; Take 1 tablet (10 mg total) by mouth daily.  Dispense: 30 tablet; Refill: 11  3. BMI 40.0-44.9, adult (HCC) Obesity Counseling: Risk Assessment: An assessment of behavioral risk factors was made today and includes lack of exercise sedentary lifestyle, lack of portion control and poor dietary habits.  Risk Modification Advice: She was counseled on portion control guidelines. Restricting daily caloric intake to. . The detrimental long term effects of obesity on her health and ongoing poor compliance was also discussed with the patient.  4. Dysuria - POCT Urinalysis Dipstick  General Counseling: Austin Stevenson verbalizes understanding of the findings of todays visit and agrees with plan of treatment. I have discussed any further diagnostic evaluation that may be needed or ordered today. We also reviewed his medications today. he has been encouraged to call the office with any  questions or concerns that should arise related to todays visit.   Orders Placed This Encounter  Procedures  . CULTURE, URINE COMPREHENSIVE  . POCT Urinalysis Dipstick    No orders of the defined types were placed in this encounter.   Time spent: 25 Minutes  This patient was seen by Blima Ledger AGNP-C in Collaboration with Dr Lyndon Code as a part of collaborative care agreement.  Johnna Acosta AGNP-C Internal Medicine

## 2019-09-11 LAB — CULTURE, URINE COMPREHENSIVE

## 2019-10-20 ENCOUNTER — Ambulatory Visit: Payer: Medicaid Other | Admitting: Adult Health

## 2019-10-20 ENCOUNTER — Encounter: Payer: Self-pay | Admitting: Adult Health

## 2019-10-20 ENCOUNTER — Other Ambulatory Visit: Payer: Self-pay

## 2019-10-20 VITALS — BP 141/84 | HR 120 | Temp 98.0°F | Resp 16 | Ht 73.0 in | Wt 302.0 lb

## 2019-10-20 DIAGNOSIS — N3000 Acute cystitis without hematuria: Secondary | ICD-10-CM

## 2019-10-20 DIAGNOSIS — Z6841 Body Mass Index (BMI) 40.0 and over, adult: Secondary | ICD-10-CM | POA: Diagnosis not present

## 2019-10-20 DIAGNOSIS — I151 Hypertension secondary to other renal disorders: Secondary | ICD-10-CM

## 2019-10-20 DIAGNOSIS — R300 Dysuria: Secondary | ICD-10-CM | POA: Diagnosis not present

## 2019-10-20 DIAGNOSIS — N2889 Other specified disorders of kidney and ureter: Secondary | ICD-10-CM

## 2019-10-20 LAB — POCT URINALYSIS DIPSTICK
Bilirubin, UA: NEGATIVE
Glucose, UA: NEGATIVE
Nitrite, UA: POSITIVE
Protein, UA: POSITIVE — AB
Spec Grav, UA: 1.01 (ref 1.010–1.025)
Urobilinogen, UA: 0.2 E.U./dL
pH, UA: 6 (ref 5.0–8.0)

## 2019-10-20 MED ORDER — SULFAMETHOXAZOLE-TRIMETHOPRIM 400-80 MG PO TABS: 1.0000 | ORAL_TABLET | Freq: Two times a day (BID) | ORAL | 0 refills | Status: DC

## 2019-10-20 MED ORDER — METOPROLOL TARTRATE 25 MG PO TABS: 12.5000 mg | ORAL_TABLET | Freq: Two times a day (BID) | ORAL | 0 refills | Status: DC

## 2019-10-20 NOTE — Progress Notes (Signed)
[facility] [address] [place], [place] [address]  Internal MEDICINE  Office Visit Note  Patient Name: Austin GarbeJeremy D Stevenson  [birth year]  [id number]  Date of Service: 10/20/2019  Chief Complaint  Patient presents with  . Hypertension  . Flank Pain    kidney hurt monday and urine odor    HPI Pt is seen today for follow up on HTN.  He has been taking his medication, and checking his bp at home.  He has also started exercising and has lost 14 pounds since his last visit.  He does report he has altered his diet.  He has been eating a modified low carb diet also.  His bp remains elevated at this time, and his heart rate remains above 120. He reports he has been having some kidney/flank pain, and his urine appears to smell.  He stopped reusing his catheters, and his urine cleared up with the last round of antibiotics.  However, it has become malodorous again.     Current Medication: Outpatient Encounter Medications as of 10/20/2019  Medication Sig  . lisinopril (ZESTRIL) 10 MG tablet Take 1 tablet (10 mg total) by mouth daily.  Marland Kitchen. lisinopril (ZESTRIL) 5 MG tablet TAKE 1 TABLET BY MOUTH ONCE DAILY. TAKE A 5 MG AND 10 MG TABLET FOR TOTAL OF 15 MG.  . amoxicillin-clavulanate (AUGMENTIN) 875-125 MG tablet Take 1 tablet by mouth 2 (two) times daily. (Patient not taking: Reported on 10/20/2019)   No facility-administered encounter medications on file as of 10/20/2019.     Surgical History: Past Surgical History:  Procedure Laterality Date  . ABDOMINAL SURGERY    . ABSCESS DRAINAGE  11/15/2005  . creation of cathetrizable stoma    . cystopic catheter placement   11/0/2010  . CYSTOURETHROSCOPY    . dilation of stricture  09/12/2008  . ESOPHAGOGASTRODUODENOSCOPY (EGD) WITH PROPOFOL N/A 11/25/2016   Procedure: ESOPHAGOGASTRODUODENOSCOPY (EGD) WITH PROPOFOL;  Surgeon: Midge Miniumarren Wohl, MD;  Location: [facility] ENDOSCOPY;  Service: Endoscopy;  Laterality: N/A;  . MITROFANOFF PROCEDURE  09/10/2005   . SUPRAPUBIC CATHETER PLACEMENT  09/28/2005  . TRANSRECTAL DRAINAGE OF PELVIC ABSCESS    . URETHRAL DILATION      Medical History: Past Medical History:  Diagnosis Date  . Atrophic kidney   . Chronic kidney disease    stage 1  . Hypertension   . Obesity   . 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 Urethral fistula     Family History: Family History  Problem Relation Age of Onset  . Diabetes Mother   . Hypertension Paternal Grandfather     Social History   Socioeconomic History  . Marital status: Single    Spouse name: Not on file  . Number of children: Not on file  . Years of education: Not on file  . Highest education level: Not on file  Occupational History  . Not on file  Social Needs  . Financial resource strain: Not on file  . Food insecurity    Worry: Not on file    Inability: Not on file  . Transportation needs    Medical: Not on file    Non-medical: Not on file  Tobacco Use  . Smoking status: Former Smoker    Types: Cigars  . Smokeless tobacco: Never Used  Substance and Sexual Activity  . Alcohol use: No  . Drug use: No  . Sexual activity: Yes  Lifestyle  . Physical activity    Days per week: Not on file  Minutes per session: Not on file  . Stress: Not on file  Relationships  . Social Herbalist on phone: Not on file    Gets together: Not on file    Attends religious service: Not on file    Active member of club or organization: Not on file    Attends meetings of clubs or organizations: Not on file    Relationship status: Not on file  . Intimate partner violence    Fear of current or ex partner: Not on file    Emotionally abused: Not on file    Physically abused: Not on file    Forced sexual activity: Not on file  Other Topics Concern  . Not on file  Social History Narrative  . Not on file      Review of Systems  Constitutional: Negative.  Negative for chills, fatigue and unexpected weight change.  HENT: Negative.   Negative for congestion, rhinorrhea, sneezing and sore throat.   Eyes: Negative for redness.  Respiratory: Negative.  Negative for cough, chest tightness and shortness of breath.   Cardiovascular: Negative.  Negative for chest pain and palpitations.  Gastrointestinal: Negative.  Negative for abdominal pain, constipation, diarrhea, nausea and vomiting.  Endocrine: Negative.   Genitourinary: Negative.  Negative for dysuria and frequency.  Musculoskeletal: Negative.  Negative for arthralgias, back pain, joint swelling and neck pain.  Skin: Negative.  Negative for rash.  Allergic/Immunologic: Negative.   Neurological: Negative.  Negative for tremors and numbness.  Hematological: Negative for adenopathy. Does not bruise/bleed easily.  Psychiatric/Behavioral: Negative.  Negative for behavioral problems, sleep disturbance and suicidal ideas. The patient is not nervous/anxious.     Vital Signs: BP (!) 141/84   Pulse (!) 120   Temp 98 F (36.7 C)   Resp 16   Ht 6\' 1"  (1.854 m)   Wt (!) 302 lb (137 kg)   SpO2 99%   BMI 39.84 kg/m    Physical Exam Vitals signs and nursing note reviewed.  Constitutional:      General: He is not in acute distress.    Appearance: He is well-developed. He is not diaphoretic.  HENT:     Head: Normocephalic and atraumatic.     Mouth/Throat:     Pharynx: No oropharyngeal exudate.  Eyes:     Pupils: Pupils are equal, round, and reactive to light.  Neck:     Musculoskeletal: Normal range of motion and neck supple.     Thyroid: No thyromegaly.     Vascular: No JVD.     Trachea: No tracheal deviation.  Cardiovascular:     Rate and Rhythm: Normal rate and regular rhythm.     Heart sounds: Normal heart sounds. No murmur. No friction rub. No gallop.   Pulmonary:     Effort: Pulmonary effort is normal. No respiratory distress.     Breath sounds: Normal breath sounds. No wheezing or rales.  Chest:     Chest wall: No tenderness.  Abdominal:     Palpations:  Abdomen is soft.     Tenderness: There is no abdominal tenderness. There is no guarding.  Musculoskeletal: Normal range of motion.  Lymphadenopathy:     Cervical: No cervical adenopathy.  Skin:    General: Skin is warm and dry.  Neurological:     Mental Status: He is alert and oriented to person, place, and time.     Cranial Nerves: No cranial nerve deficit.  Psychiatric:  Behavior: Behavior normal.        Thought Content: Thought content normal.        Judgment: Judgment normal.    Assessment/Plan: 1. Hypertension secondary to other renal disorders Continue lisinopril, and take Lopressor as discussed, follow up in 3 weeks.  - metoprolol tartrate (LOPRESSOR) 25 MG tablet; Take 0.5 tablets (12.5 mg total) by mouth 2 (two) times daily.  Dispense: 60 tablet; Refill: 0  2. Acute cystitis without hematuria Advised patient to take entire course of antibiotics as prescribed with food. Pt should return to clinic in 7-10 days if symptoms fail to improve or new symptoms develop.  -bactrim Po BID x 10 days - CULTURE, URINE COMPREHENSIVE  3. BMI 40.0-44.9, adult (HCC) Obesity Counseling: Risk Assessment: An assessment of behavioral risk factors was made today and includes lack of exercise sedentary lifestyle, lack of portion control and poor dietary habits.  Risk Modification Advice: She was counseled on portion control guidelines. Restricting daily caloric intake to. . The detrimental long term effects of obesity on her health and ongoing poor compliance was also discussed with the patient.  4. Dysuria See flowsheet  - POCT Urinalysis Dipstick  General Counseling: Londen verbalizes understanding of the findings of todays visit and agrees with plan of treatment. I have discussed any further diagnostic evaluation that may be needed or ordered today. We also reviewed his medications today. he has been encouraged to call the office with any questions or concerns that should arise related  to todays visit.    Orders Placed This Encounter  Procedures  . POCT Urinalysis Dipstick    No orders of the defined types were placed in this encounter.   Time spent: 25 Minutes   This patient was seen by Blima Ledger AGNP-C in Collaboration with Dr Lyndon Code as a part of collaborative care agreement     Johnna Acosta AGNP-C Internal medicine

## 2019-10-23 LAB — CULTURE, URINE COMPREHENSIVE

## 2019-11-08 ENCOUNTER — Telehealth: Payer: Self-pay

## 2019-11-08 NOTE — Telephone Encounter (Signed)
Confirmed appointment with patient. klh °

## 2019-11-10 ENCOUNTER — Encounter: Payer: Self-pay | Admitting: Adult Health

## 2019-11-10 ENCOUNTER — Ambulatory Visit: Payer: Medicaid Other | Admitting: Adult Health

## 2019-11-10 ENCOUNTER — Other Ambulatory Visit: Payer: Self-pay

## 2019-11-10 VITALS — BP 126/80 | HR 95 | Temp 98.2°F | Resp 16 | Ht 73.0 in | Wt 294.0 lb

## 2019-11-10 DIAGNOSIS — Z6841 Body Mass Index (BMI) 40.0 and over, adult: Secondary | ICD-10-CM | POA: Diagnosis not present

## 2019-11-10 DIAGNOSIS — Z23 Encounter for immunization: Secondary | ICD-10-CM

## 2019-11-10 DIAGNOSIS — I151 Hypertension secondary to other renal disorders: Secondary | ICD-10-CM

## 2019-11-10 DIAGNOSIS — N2889 Other specified disorders of kidney and ureter: Secondary | ICD-10-CM

## 2019-11-10 DIAGNOSIS — N3000 Acute cystitis without hematuria: Secondary | ICD-10-CM

## 2019-11-10 MED ORDER — METOPROLOL TARTRATE 25 MG PO TABS: 12.5000 mg | ORAL_TABLET | Freq: Two times a day (BID) | ORAL | 2 refills | Status: DC

## 2019-11-10 NOTE — Progress Notes (Signed)
[facility] [address] [place], [place] [address]  Internal MEDICINE  Office Visit Note  Patient Name: Austin GarbeJeremy D Stevenson  [birth year]  [id number]  Date of Service: 11/21/2019  Chief Complaint  Patient presents with  . Medical Management of Chronic Issues    3 week follow up   . Hypertension    HPI  Pt is here for 3 week follow up.  He reports he took the course of bactrim without difficulty, and he denies any urinary symptoms at this time.  He reports feeling back to normal.  He also was started on Metoprolol at last visit, and his BP and heart rate have improved.  He denies any side effects at this time. He has lost 9 pounds since last visit two weeks ago.    Current Medication: Outpatient Encounter Medications as of 11/10/2019  Medication Sig  . lisinopril (ZESTRIL) 10 MG tablet Take 1 tablet (10 mg total) by mouth daily.  . metoprolol tartrate (LOPRESSOR) 25 MG tablet Take 0.5 tablets (12.5 mg total) by mouth 2 (two) times daily.  . [DISCONTINUED] metoprolol tartrate (LOPRESSOR) 25 MG tablet Take 0.5 tablets (12.5 mg total) by mouth 2 (two) times daily.  Marland Kitchen. lisinopril (ZESTRIL) 5 MG tablet TAKE 1 TABLET BY MOUTH ONCE DAILY. TAKE A 5 MG AND 10 MG TABLET FOR TOTAL OF 15 MG. (Patient not taking: Reported on 11/10/2019)  . [DISCONTINUED] amoxicillin-clavulanate (AUGMENTIN) 875-125 MG tablet Take 1 tablet by mouth 2 (two) times daily. (Patient not taking: Reported on 10/20/2019)  . [DISCONTINUED] sulfamethoxazole-trimethoprim (BACTRIM) 400-80 MG tablet Take 1 tablet by mouth 2 (two) times daily. (Patient not taking: Reported on 11/10/2019)   No facility-administered encounter medications on file as of 11/10/2019.     Surgical History: Past Surgical History:  Procedure Laterality Date  . ABDOMINAL SURGERY    . ABSCESS DRAINAGE  11/15/2005  . creation of cathetrizable stoma    . cystopic catheter placement   11/0/2010  . CYSTOURETHROSCOPY    . dilation of stricture   09/12/2008  . ESOPHAGOGASTRODUODENOSCOPY (EGD) WITH PROPOFOL N/A 11/25/2016   Procedure: ESOPHAGOGASTRODUODENOSCOPY (EGD) WITH PROPOFOL;  Surgeon: Midge Miniumarren Wohl, MD;  Location: [facility] ENDOSCOPY;  Service: Endoscopy;  Laterality: N/A;  . MITROFANOFF PROCEDURE  09/10/2005  . SUPRAPUBIC CATHETER PLACEMENT  09/28/2005  . TRANSRECTAL DRAINAGE OF PELVIC ABSCESS    . URETHRAL DILATION      Medical History: Past Medical History:  Diagnosis Date  . Atrophic kidney   . Chronic kidney disease    stage 1  . Hypertension   . Obesity   . 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 Urethral fistula     Family History: Family History  Problem Relation Age of Onset  . Diabetes Mother   . Hypertension Paternal Grandfather     Social History   Socioeconomic History  . Marital status: Single    Spouse name: Not on file  . Number of children: Not on file  . Years of education: Not on file  . Highest education level: Not on file  Occupational History  . Not on file  Social Needs  . Financial resource strain: Not on file  . Food insecurity    Worry: Not on file    Inability: Not on file  . Transportation needs    Medical: Not on file    Non-medical: Not on file  Tobacco Use  . Smoking status: Former Smoker    Types: Cigars  . Smokeless tobacco: Never Used  Substance and Sexual Activity  . Alcohol use: No  . Drug use: No  . Sexual activity: Yes  Lifestyle  . Physical activity    Days per week: Not on file    Minutes per session: Not on file  . Stress: Not on file  Relationships  . Social Herbalist on phone: Not on file    Gets together: Not on file    Attends religious service: Not on file    Active member of club or organization: Not on file    Attends meetings of clubs or organizations: Not on file    Relationship status: Not on file  . Intimate partner violence    Fear of current or ex partner: Not on file    Emotionally abused: Not on file    Physically abused:  Not on file    Forced sexual activity: Not on file  Other Topics Concern  . Not on file  Social History Narrative  . Not on file      Review of Systems  Constitutional: Negative.  Negative for chills, fatigue and unexpected weight change.  HENT: Negative.  Negative for congestion, rhinorrhea, sneezing and sore throat.   Eyes: Negative for redness.  Respiratory: Negative.  Negative for cough, chest tightness and shortness of breath.   Cardiovascular: Negative.  Negative for chest pain and palpitations.  Gastrointestinal: Negative.  Negative for abdominal pain, constipation, diarrhea, nausea and vomiting.  Endocrine: Negative.   Genitourinary: Negative.  Negative for dysuria and frequency.  Musculoskeletal: Negative.  Negative for arthralgias, back pain, joint swelling and neck pain.  Skin: Negative.  Negative for rash.  Allergic/Immunologic: Negative.   Neurological: Negative.  Negative for tremors and numbness.  Hematological: Negative for adenopathy. Does not bruise/bleed easily.  Psychiatric/Behavioral: Negative.  Negative for behavioral problems, sleep disturbance and suicidal ideas. The patient is not nervous/anxious.     Vital Signs: BP 126/80   Pulse 95   Temp 98.2 F (36.8 C)   Resp 16   Ht 6\' 1"  (1.854 m)   Wt 294 lb (133.4 kg)   SpO2 98%   BMI 38.79 kg/m    Physical Exam Vitals signs and nursing note reviewed.  Constitutional:      General: He is not in acute distress.    Appearance: He is well-developed. He is not diaphoretic.  HENT:     Head: Normocephalic and atraumatic.     Mouth/Throat:     Pharynx: No oropharyngeal exudate.  Eyes:     Pupils: Pupils are equal, round, and reactive to light.  Neck:     Musculoskeletal: Normal range of motion and neck supple.     Thyroid: No thyromegaly.     Vascular: No JVD.     Trachea: No tracheal deviation.  Cardiovascular:     Rate and Rhythm: Normal rate and regular rhythm.     Heart sounds: Normal heart  sounds. No murmur. No friction rub. No gallop.   Pulmonary:     Effort: Pulmonary effort is normal. No respiratory distress.     Breath sounds: Normal breath sounds. No wheezing or rales.  Chest:     Chest wall: No tenderness.  Abdominal:     Palpations: Abdomen is soft.     Tenderness: There is no abdominal tenderness. There is no guarding.  Musculoskeletal: Normal range of motion.  Lymphadenopathy:     Cervical: No cervical adenopathy.  Skin:    General: Skin is warm and dry.  Neurological:     Mental Status: He is alert and oriented to person, place, and time.     Cranial Nerves: No cranial nerve deficit.  Psychiatric:        Behavior: Behavior normal.        Thought Content: Thought content normal.        Judgment: Judgment normal.    Assessment/Plan: 1. Hypertension secondary to other renal disorders Stable, continue lopressor as discussed.  - metoprolol tartrate (LOPRESSOR) 25 MG tablet; Take 0.5 tablets (12.5 mg total) by mouth 2 (two) times daily.  Dispense: 60 tablet; Refill: 2  2. Acute cystitis without hematuria Stable, continue  3. BMI 40.0-44.9, adult [facility]) Encouraged patient to continue to diet, and exercise. 9 pound weight loss since starting to exercise and diet 2 weeks ago.   Obesity Counseling: Risk Assessment: An assessment of behavioral risk factors was made today and includes lack of exercise sedentary lifestyle, lack of portion control and poor dietary habits.  Risk Modification Advice: She was counseled on portion control guidelines. Restricting daily caloric intake to. . The detrimental long term effects of obesity on her health and ongoing poor compliance was also discussed with the patient.  4. Flu vaccine need - Flu Vaccine MDCK QUAD PF  General Counseling: Austin Stevenson verbalizes understanding of the findings of todays visit and agrees with plan of treatment. I have discussed any further diagnostic evaluation that may be needed or ordered today. We also  reviewed his medications today. he has been encouraged to call the office with any questions or concerns that should arise related to todays visit.    Orders Placed This Encounter  Procedures  . Flu Vaccine MDCK QUAD PF    Meds ordered this encounter  Medications  . metoprolol tartrate (LOPRESSOR) 25 MG tablet    Sig: Take 0.5 tablets (12.5 mg total) by mouth 2 (two) times daily.    Dispense:  60 tablet    Refill:  2    Time spent: 25 Minutes   This patient was seen by Blima Ledger AGNP-C in Collaboration with Dr Lyndon Code as a part of collaborative care agreement     Johnna Acosta AGNP-C Internal medicine

## 2019-11-26 ENCOUNTER — Other Ambulatory Visit: Payer: Self-pay

## 2019-11-26 DIAGNOSIS — Z20822 Contact with and (suspected) exposure to covid-19: Secondary | ICD-10-CM

## 2019-11-27 LAB — NOVEL CORONAVIRUS, NAA: SARS-CoV-2, NAA: NOT DETECTED

## 2019-12-01 ENCOUNTER — Ambulatory Visit: Payer: Medicaid Other | Attending: Internal Medicine

## 2019-12-01 ENCOUNTER — Other Ambulatory Visit: Payer: Self-pay

## 2019-12-01 DIAGNOSIS — Z20822 Contact with and (suspected) exposure to covid-19: Secondary | ICD-10-CM

## 2019-12-03 LAB — NOVEL CORONAVIRUS, NAA: SARS-CoV-2, NAA: NOT DETECTED

## 2019-12-15 ENCOUNTER — Emergency Department
Admission: EM | Admit: 2019-12-15 | Discharge: 2019-12-15 | Disposition: A | Discharge status: Home / Self Care | Payer: Medicaid Other

## 2019-12-15 NOTE — ED Triage Notes (Signed)
pt called for triage, no response.   

## 2019-12-15 NOTE — ED Notes (Signed)
Final call-no response.

## 2019-12-15 NOTE — ED Triage Notes (Signed)
Called from [facility] to treatment room, no response 

## 2019-12-20 ENCOUNTER — Telehealth: Payer: Self-pay

## 2019-12-20 NOTE — Telephone Encounter (Signed)
Confirmed appointment with patient. klh °

## 2019-12-22 ENCOUNTER — Ambulatory Visit: Payer: Medicaid Other | Admitting: Adult Health

## 2019-12-22 ENCOUNTER — Other Ambulatory Visit: Payer: Self-pay

## 2019-12-22 ENCOUNTER — Encounter: Payer: Self-pay | Admitting: Adult Health

## 2019-12-22 VITALS — BP 127/74 | HR 100 | Resp 16 | Ht 73.0 in | Wt 278.0 lb

## 2019-12-22 DIAGNOSIS — Z6836 Body mass index (BMI) 36.0-36.9, adult: Secondary | ICD-10-CM | POA: Diagnosis not present

## 2019-12-22 DIAGNOSIS — N2889 Other specified disorders of kidney and ureter: Secondary | ICD-10-CM | POA: Diagnosis not present

## 2019-12-22 DIAGNOSIS — I151 Hypertension secondary to other renal disorders: Secondary | ICD-10-CM

## 2019-12-22 DIAGNOSIS — Z789 Other specified health status: Secondary | ICD-10-CM | POA: Diagnosis not present

## 2019-12-22 MED ORDER — METOPROLOL SUCCINATE ER 50 MG PO TB24: 50.0000 mg | ORAL_TABLET | Freq: Every day | ORAL | 2 refills | Status: DC

## 2019-12-22 NOTE — Progress Notes (Signed)
[facility], [place] [address]  Internal MEDICINE  Office Visit Note  Patient Name: Austin Stevenson  [id number]  [id number]  Date of Service: 12/26/2019  Chief Complaint  Patient presents with  . Hypertension    HPI  Pt is here for follow up on HTN, His bp is much better, however he continues to have some tachycardia. His pulse was initially 113, had decreased to 100 on exam.  He Denies Chest pain, Shortness of breath, palpitations, headache, or blurred vision.   Urinary stoma closed, had stoma dilated, and new cath placed temporarily, should be out next week.  17 pound weight loss since 11/25, pt is doing low carb diet and attempting to lose weight with exercise.  Overall he is doing well.    Current Medication: Outpatient Encounter Medications as of 12/22/2019  Medication Sig  . lisinopril (ZESTRIL) 10 MG tablet Take 1 tablet (10 mg total) by mouth daily.  Marland Kitchen lisinopril (ZESTRIL) 5 MG tablet TAKE 1 TABLET BY MOUTH ONCE DAILY. TAKE A 5 MG AND 10 MG TABLET FOR TOTAL OF 15 MG.  . metoprolol tartrate (LOPRESSOR) 25 MG tablet Take 0.5 tablets (12.5 mg total) by mouth 2 (two) times daily.  . metoprolol succinate (TOPROL-XL) 50 MG 24 hr tablet Take 1 tablet (50 mg total) by mouth daily. Take with or immediately following a meal.   No facility-administered encounter medications on file as of 12/22/2019.    Surgical History: Past Surgical History:  Procedure Laterality Date  . ABDOMINAL SURGERY    . ABSCESS DRAINAGE  11/15/2005  . creation of cathetrizable stoma    . cystopic catheter placement   11/0/2010  . CYSTOURETHROSCOPY    . dilation of stricture  09/12/2008  . ESOPHAGOGASTRODUODENOSCOPY (EGD) WITH PROPOFOL N/A 11/25/2016   Procedure: ESOPHAGOGASTRODUODENOSCOPY (EGD) WITH PROPOFOL;  Surgeon: Lucilla Lame, MD;  Location: [facility] ENDOSCOPY;  Service: Endoscopy;  Laterality: N/A;  . MITROFANOFF PROCEDURE  09/10/2005  . SUPRAPUBIC CATHETER PLACEMENT   09/28/2005  . TRANSRECTAL DRAINAGE OF PELVIC ABSCESS    . URETHRAL DILATION      Medical History: Past Medical History:  Diagnosis Date  . Atrophic kidney   . Chronic kidney disease    stage 1  . Hypertension   . Obesity   . 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 Urethral fistula     Family History: Family History  Problem Relation Age of Onset  . Diabetes Mother   . Hypertension Paternal Grandfather     Social History   Socioeconomic History  . Marital status: Single    Spouse name: Not on file  . Number of children: Not on file  . Years of education: Not on file  . Highest education level: Not on file  Occupational History  . Not on file  Tobacco Use  . Smoking status: Former Smoker    Types: Cigars  . Smokeless tobacco: Never Used  Substance and Sexual Activity  . Alcohol use: No  . Drug use: No  . Sexual activity: Yes  Other Topics Concern  . Not on file  Social History Narrative  . Not on file   Social Determinants of Health   Financial Resource Strain:   . Difficulty of Paying Living Expenses: Not on file  Food Insecurity:   . Worried About Charity fundraiser in the Last Year: Not on file  . Ran Out of Food in the Last Year: Not on file  Transportation Needs:   .  Lack of Transportation (Medical): Not on file  . Lack of Transportation (Non-Medical): Not on file  Physical Activity:   . Days of Exercise per Week: Not on file  . Minutes of Exercise per Session: Not on file  Stress:   . Feeling of Stress : Not on file  Social Connections:   . Frequency of Communication with Friends and Family: Not on file  . Frequency of Social Gatherings with Friends and Family: Not on file  . Attends Religious Services: Not on file  . Active Member of Clubs or Organizations: Not on file  . Attends Banker Meetings: Not on file  . Marital Status: Not on file  Intimate Partner Violence:   . Fear of Current or Ex-Partner: Not on file  .  Emotionally Abused: Not on file  . Physically Abused: Not on file  . Sexually Abused: Not on file      Review of Systems  Constitutional: Negative.  Negative for chills, fatigue and unexpected weight change.  HENT: Negative.  Negative for congestion, rhinorrhea, sneezing and sore throat.   Eyes: Negative for redness.  Respiratory: Negative.  Negative for cough, chest tightness and shortness of breath.   Cardiovascular: Negative.  Negative for chest pain and palpitations.  Gastrointestinal: Negative.  Negative for abdominal pain, constipation, diarrhea, nausea and vomiting.  Endocrine: Negative.   Genitourinary: Negative.  Negative for dysuria and frequency.  Musculoskeletal: Negative.  Negative for arthralgias, back pain, joint swelling and neck pain.  Skin: Negative.  Negative for rash.  Allergic/Immunologic: Negative.   Neurological: Negative.  Negative for tremors and numbness.  Hematological: Negative for adenopathy. Does not bruise/bleed easily.  Psychiatric/Behavioral: Negative.  Negative for behavioral problems, sleep disturbance and suicidal ideas. The patient is not nervous/anxious.     Vital Signs: BP 127/74   Pulse 100   Resp 16   Ht 6\' 1"  (1.854 m)   Wt 278 lb (126.1 kg)   SpO2 98%   BMI 36.68 kg/m    Physical Exam Vitals and nursing note reviewed.  Constitutional:      General: He is not in acute distress.    Appearance: He is well-developed. He is not diaphoretic.  HENT:     Head: Normocephalic and atraumatic.     Mouth/Throat:     Pharynx: No oropharyngeal exudate.  Eyes:     Pupils: Pupils are equal, round, and reactive to light.  Neck:     Thyroid: No thyromegaly.     Vascular: No JVD.     Trachea: No tracheal deviation.  Cardiovascular:     Rate and Rhythm: Normal rate and regular rhythm.     Heart sounds: Normal heart sounds. No murmur. No friction rub. No gallop.   Pulmonary:     Effort: Pulmonary effort is normal. No respiratory distress.      Breath sounds: Normal breath sounds. No wheezing or rales.  Chest:     Chest wall: No tenderness.  Abdominal:     Palpations: Abdomen is soft.     Tenderness: There is no abdominal tenderness. There is no guarding.  Musculoskeletal:        General: Normal range of motion.     Cervical back: Normal range of motion and neck supple.  Lymphadenopathy:     Cervical: No cervical adenopathy.  Skin:    General: Skin is warm and dry.  Neurological:     Mental Status: He is alert and oriented to person, place, and time.  Cranial Nerves: No cranial nerve deficit.  Psychiatric:        Behavior: Behavior normal.        Thought Content: Thought content normal.        Judgment: Judgment normal.    Assessment/Plan: 1. Hypertension secondary to other renal disorders Increase Toprol to 50mg  daily.   - metoprolol succinate (TOPROL-XL) 50 MG 24 hr tablet; Take 1 tablet (50 mg total) by mouth daily. Take with or immediately following a meal.  Dispense: 30 tablet; Refill: 2  2. BMI 36.0-36.9,adult Doing well with weight loss, encouraged continued diet and exercise.  Obesity Counseling: Risk Assessment: An assessment of behavioral risk factors was made today and includes lack of exercise sedentary lifestyle, lack of portion control and poor dietary habits.  Risk Modification Advice: She was counseled on portion control guidelines. Restricting daily caloric intake to 1500. The detrimental long term effects of obesity on her health and ongoing poor compliance was also discussed with the patient.  3. Intermittent self-catheterization of bladder Has temporary cath at this time, should return to self cath next week.  Continue to follow.   General Counseling: rochelle nephew understanding of the findings of todays visit and agrees with plan of treatment. I have discussed any further diagnostic evaluation that may be needed or ordered today. We also reviewed his medications today. he has been encouraged  to call the office with any questions or concerns that should arise related to todays visit.    No orders of the defined types were placed in this encounter.   Meds ordered this encounter  Medications  . metoprolol succinate (TOPROL-XL) 50 MG 24 hr tablet    Sig: Take 1 tablet (50 mg total) by mouth daily. Take with or immediately following a meal.    Dispense:  30 tablet    Refill:  2    Time spent: 15 Minutes   This patient was seen by Jeanell Sparrow AGNP-C in Collaboration with Dr Blima Ledger as a part of collaborative care agreement     Lyndon Code AGNP-C Internal medicine

## 2020-01-17 ENCOUNTER — Telehealth: Payer: Self-pay

## 2020-01-17 NOTE — Telephone Encounter (Signed)
Confirmed virtual visit with patient. klh 

## 2020-01-19 ENCOUNTER — Encounter: Payer: Self-pay | Admitting: Adult Health

## 2020-01-19 ENCOUNTER — Other Ambulatory Visit: Payer: Self-pay

## 2020-01-19 ENCOUNTER — Ambulatory Visit: Payer: Medicaid Other | Admitting: Adult Health

## 2020-01-19 VITALS — Ht 72.0 in | Wt 276.0 lb

## 2020-01-19 DIAGNOSIS — N2889 Other specified disorders of kidney and ureter: Secondary | ICD-10-CM

## 2020-01-19 DIAGNOSIS — I151 Hypertension secondary to other renal disorders: Secondary | ICD-10-CM | POA: Diagnosis not present

## 2020-01-19 DIAGNOSIS — Z6836 Body mass index (BMI) 36.0-36.9, adult: Secondary | ICD-10-CM

## 2020-01-19 DIAGNOSIS — N181 Chronic kidney disease, stage 1: Secondary | ICD-10-CM

## 2020-01-19 DIAGNOSIS — Z789 Other specified health status: Secondary | ICD-10-CM | POA: Diagnosis not present

## 2020-01-19 NOTE — Progress Notes (Signed)
[facility] [place], [place] [address]  Internal MEDICINE  Telephone Visit  Patient Name: Austin Stevenson  [id number]  [id number]  Date of Service: 01/19/2020  I connected with the patient at 147 by telephone and verified the patients identity using two identifiers.   I discussed the limitations, risks, security and privacy concerns of performing an evaluation and management service by telephone and the availability of in person appointments. I also discussed with the patient that there may be a patient responsible charge related to the service.  The patient expressed understanding and agrees to proceed.    Chief Complaint  Patient presents with  . Telephone Screen    bp in range 120/80  . Telephone Assessment  . Hypertension    HPI  Pt is seen via video.  He is following up on HTN and Tachycardia.  He overall has been doing well.  He reports is bp has been controlled, and his heart rate has remained in the 90's.  He continues to have issues with his urinary cath.  He has made a couple visits to the ER for issues with his ostomy.  He has an appt Monday with Urology, if they are unable to get his cath issues corrected, he will have to have surgery to have a new ostomy placed.    Current Medication: Outpatient Encounter Medications as of 01/19/2020  Medication Sig  . lisinopril (ZESTRIL) 10 MG tablet Take 1 tablet (10 mg total) by mouth daily.  Marland Kitchen lisinopril (ZESTRIL) 5 MG tablet TAKE 1 TABLET BY MOUTH ONCE DAILY. TAKE A 5 MG AND 10 MG TABLET FOR TOTAL OF 15 MG.  . metoprolol succinate (TOPROL-XL) 50 MG 24 hr tablet Take 1 tablet (50 mg total) by mouth daily. Take with or immediately following a meal.  . [DISCONTINUED] metoprolol tartrate (LOPRESSOR) 25 MG tablet Take 0.5 tablets (12.5 mg total) by mouth 2 (two) times daily. (Patient not taking: Reported on 01/19/2020)   No facility-administered encounter medications on file as of 01/19/2020.    Surgical  History: Past Surgical History:  Procedure Laterality Date  . ABDOMINAL SURGERY    . ABSCESS DRAINAGE  11/15/2005  . creation of cathetrizable stoma    . cystopic catheter placement   11/0/2010  . CYSTOURETHROSCOPY    . dilation of stricture  09/12/2008  . ESOPHAGOGASTRODUODENOSCOPY (EGD) WITH PROPOFOL N/A 11/25/2016   Procedure: ESOPHAGOGASTRODUODENOSCOPY (EGD) WITH PROPOFOL;  Surgeon: Lucilla Lame, MD;  Location: [facility] ENDOSCOPY;  Service: Endoscopy;  Laterality: N/A;  . MITROFANOFF PROCEDURE  09/10/2005  . SUPRAPUBIC CATHETER PLACEMENT  09/28/2005  . TRANSRECTAL DRAINAGE OF PELVIC ABSCESS    . URETHRAL DILATION      Medical History: Past Medical History:  Diagnosis Date  . Atrophic kidney   . Chronic kidney disease    stage 1  . Hypertension   . Obesity   . 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 Urethral fistula     Family History: Family History  Problem Relation Age of Onset  . Diabetes Mother   . Hypertension Paternal Grandfather     Social History   Socioeconomic History  . Marital status: Single    Spouse name: Not on file  . Number of children: Not on file  . Years of education: Not on file  . Highest education level: Not on file  Occupational History  . Not on file  Tobacco Use  . Smoking status: Former Smoker    Types: Cigars  .  Smokeless tobacco: Never Used  Substance and Sexual Activity  . Alcohol use: No  . Drug use: No  . Sexual activity: Yes  Other Topics Concern  . Not on file  Social History Narrative  . Not on file   Social Determinants of Health   Financial Resource Strain:   . Difficulty of Paying Living Expenses: Not on file  Food Insecurity:   . Worried About Programme researcher, broadcasting/film/video in the Last Year: Not on file  . Ran Out of Food in the Last Year: Not on file  Transportation Needs:   . Lack of Transportation (Medical): Not on file  . Lack of Transportation (Non-Medical): Not on file  Physical Activity:   . Days of Exercise  per Week: Not on file  . Minutes of Exercise per Session: Not on file  Stress:   . Feeling of Stress : Not on file  Social Connections:   . Frequency of Communication with Friends and Family: Not on file  . Frequency of Social Gatherings with Friends and Family: Not on file  . Attends Religious Services: Not on file  . Active Member of Clubs or Organizations: Not on file  . Attends Banker Meetings: Not on file  . Marital Status: Not on file  Intimate Partner Violence:   . Fear of Current or Ex-Partner: Not on file  . Emotionally Abused: Not on file  . Physically Abused: Not on file  . Sexually Abused: Not on file      Review of Systems  Constitutional: Negative.  Negative for chills, fatigue and unexpected weight change.  HENT: Negative.  Negative for congestion, rhinorrhea, sneezing and sore throat.   Eyes: Negative for redness.  Respiratory: Negative.  Negative for cough, chest tightness and shortness of breath.   Cardiovascular: Negative.  Negative for chest pain and palpitations.  Gastrointestinal: Negative.  Negative for abdominal pain, constipation, diarrhea, nausea and vomiting.  Endocrine: Negative.   Genitourinary: Negative.  Negative for dysuria and frequency.  Musculoskeletal: Negative.  Negative for arthralgias, back pain, joint swelling and neck pain.  Skin: Negative.  Negative for rash.  Allergic/Immunologic: Negative.   Neurological: Negative.  Negative for tremors and numbness.  Hematological: Negative for adenopathy. Does not bruise/bleed easily.  Psychiatric/Behavioral: Negative.  Negative for behavioral problems, sleep disturbance and suicidal ideas. The patient is not nervous/anxious.     Vital Signs: Ht 6' (1.829 m)   Wt 276 lb (125.2 kg)   BMI 37.43 kg/m    Observation/Objective:  Well appearing, noted  Assessment/Plan: 1. Hypertension secondary to other renal disorders Controlled with lisinopril and metoprolol at this time.   2.  Intermittent self-catheterization of bladder Pt has temp catheter at this time.  Sees urology on Monday, continue to follow.  3. BMI 36.0-36.9,adult Obesity Counseling: Risk Assessment: An assessment of behavioral risk factors was made today and includes lack of exercise sedentary lifestyle, lack of portion control and poor dietary habits.  Risk Modification Advice: She was counseled on portion control guidelines. Restricting daily caloric intake to 1700. The detrimental long term effects of obesity on her health and ongoing poor compliance was also discussed with the patient.  4. CKD (chronic kidney disease) stage 1, GFR 90 ml/min or greater Continue to see nephrology as before.   General Counseling: kweku stankey understanding of the findings of today's phone visit and agrees with plan of treatment. I have discussed any further diagnostic evaluation that may be needed or ordered today. We also reviewed  his medications today. he has been encouraged to call the office with any questions or concerns that should arise related to todays visit.    No orders of the defined types were placed in this encounter.   No orders of the defined types were placed in this encounter.   Time spent: 20 Minutes    Blima Ledger [facility] Internal medicine

## 2020-03-14 ENCOUNTER — Telehealth: Payer: Self-pay

## 2020-03-14 NOTE — Telephone Encounter (Signed)
Confirmed appointment on 03/16/2020 and screened for covid. klh 

## 2020-03-16 ENCOUNTER — Encounter: Payer: Self-pay | Admitting: Adult Health

## 2020-03-16 ENCOUNTER — Other Ambulatory Visit: Payer: Self-pay

## 2020-03-16 ENCOUNTER — Ambulatory Visit: Payer: Medicaid Other | Admitting: Adult Health

## 2020-03-16 VITALS — BP 126/77 | HR 66 | Temp 98.7°F | Resp 16 | Ht 72.0 in | Wt 272.0 lb

## 2020-03-16 DIAGNOSIS — I151 Hypertension secondary to other renal disorders: Secondary | ICD-10-CM

## 2020-03-16 DIAGNOSIS — Z789 Other specified health status: Secondary | ICD-10-CM

## 2020-03-16 DIAGNOSIS — Z6836 Body mass index (BMI) 36.0-36.9, adult: Secondary | ICD-10-CM | POA: Diagnosis not present

## 2020-03-16 DIAGNOSIS — N181 Chronic kidney disease, stage 1: Secondary | ICD-10-CM

## 2020-03-16 DIAGNOSIS — N2889 Other specified disorders of kidney and ureter: Secondary | ICD-10-CM

## 2020-03-16 MED ORDER — METOPROLOL SUCCINATE ER 50 MG PO TB24: 50.0000 mg | ORAL_TABLET | Freq: Every day | ORAL | 1 refills | Status: DC

## 2020-03-16 MED ORDER — LISINOPRIL 10 MG PO TABS: 10.0000 mg | ORAL_TABLET | Freq: Every day | ORAL | 1 refills | Status: DC

## 2020-03-16 MED ORDER — LISINOPRIL 5 MG PO TABS: ORAL_TABLET | ORAL | 1 refills | Status: DC

## 2020-03-16 NOTE — Progress Notes (Signed)
[facility] [address] [place], [place] [address]  Internal MEDICINE  Office Visit Note  Patient Name: Austin Stevenson  [id number]  [id number]  Date of Service: 03/16/2020  Chief Complaint  Patient presents with  . Hypertension    HPI  Pt is here for follow up on HTN. His blood pressure is much improved today. He continues to take Lisinopril and Metoprolol.  He reports "my blood pressure is better, because I'm actually taking my medicine."  Also he continues to have a suprapubic urinary catheter. He has had this most of his life.  He was intermittent cathing himself but was having difficulty.  He now has a drainage bag and urology at [facility] are working toward possibly reconnecting his urethra.           Current Medication: Outpatient Encounter Medications as of 03/16/2020  Medication Sig  . lisinopril (ZESTRIL) 10 MG tablet Take 1 tablet (10 mg total) by mouth daily.  Marland Kitchen lisinopril (ZESTRIL) 5 MG tablet TAKE 1 TABLET BY MOUTH ONCE DAILY. TAKE A 5 MG AND 10 MG TABLET FOR TOTAL OF 15 MG.  . metoprolol succinate (TOPROL-XL) 50 MG 24 hr tablet Take 1 tablet (50 mg total) by mouth daily. Take with or immediately following a meal.  . [DISCONTINUED] lisinopril (ZESTRIL) 10 MG tablet Take 1 tablet (10 mg total) by mouth daily.  . [DISCONTINUED] lisinopril (ZESTRIL) 5 MG tablet TAKE 1 TABLET BY MOUTH ONCE DAILY. TAKE A 5 MG AND 10 MG TABLET FOR TOTAL OF 15 MG.  . [DISCONTINUED] metoprolol succinate (TOPROL-XL) 50 MG 24 hr tablet Take 1 tablet (50 mg total) by mouth daily. Take with or immediately following a meal.   No facility-administered encounter medications on file as of 03/16/2020.    Surgical History: Past Surgical History:  Procedure Laterality Date  . ABDOMINAL SURGERY    . ABSCESS DRAINAGE  11/15/2005  . creation of cathetrizable stoma    . cystopic catheter placement   11/0/2010  . CYSTOURETHROSCOPY    . dilation of stricture  09/12/2008  . ESOPHAGOGASTRODUODENOSCOPY  (EGD) WITH PROPOFOL N/A 11/25/2016   Procedure: ESOPHAGOGASTRODUODENOSCOPY (EGD) WITH PROPOFOL;  Surgeon: Midge Minium, MD;  Location: [facility] ENDOSCOPY;  Service: Endoscopy;  Laterality: N/A;  . MITROFANOFF PROCEDURE  09/10/2005  . SUPRAPUBIC CATHETER PLACEMENT  09/28/2005  . TRANSRECTAL DRAINAGE OF PELVIC ABSCESS    . URETHRAL DILATION      Medical History: Past Medical History:  Diagnosis Date  . Atrophic kidney   . Chronic kidney disease    stage 1  . Hypertension   . Obesity   . 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 Urethral fistula     Family History: Family History  Problem Relation Age of Onset  . Diabetes Mother   . Hypertension Paternal Grandfather     Social History   Socioeconomic History  . Marital status: Single    Spouse name: Not on file  . Number of children: Not on file  . Years of education: Not on file  . Highest education level: Not on file  Occupational History  . Not on file  Tobacco Use  . Smoking status: Former Smoker    Types: Cigars  . Smokeless tobacco: Never Used  Substance and Sexual Activity  . Alcohol use: No  . Drug use: No  . Sexual activity: Yes  Other Topics Concern  . Not on file  Social History Narrative  . Not on file   Social Determinants of  Health   Financial Resource Strain:   . Difficulty of Paying Living Expenses:   Food Insecurity:   . Worried About Charity fundraiser in the Last Year:   . Arboriculturist in the Last Year:   Transportation Needs:   . Film/video editor (Medical):   Marland Kitchen Lack of Transportation (Non-Medical):   Physical Activity:   . Days of Exercise per Week:   . Minutes of Exercise per Session:   Stress:   . Feeling of Stress :   Social Connections:   . Frequency of Communication with Friends and Family:   . Frequency of Social Gatherings with Friends and Family:   . Attends Religious Services:   . Active Member of Clubs or Organizations:   . Attends Archivist Meetings:    Marland Kitchen Marital Status:   Intimate Partner Violence:   . Fear of Current or Ex-Partner:   . Emotionally Abused:   Marland Kitchen Physically Abused:   . Sexually Abused:       Review of Systems  Constitutional: Negative.  Negative for chills, fatigue and unexpected weight change.  HENT: Negative.  Negative for congestion, rhinorrhea, sneezing and sore throat.   Eyes: Negative for redness.  Respiratory: Negative.  Negative for cough, chest tightness and shortness of breath.   Cardiovascular: Negative.  Negative for chest pain and palpitations.  Gastrointestinal: Negative.  Negative for abdominal pain, constipation, diarrhea, nausea and vomiting.  Endocrine: Negative.   Genitourinary: Negative.  Negative for dysuria and frequency.  Musculoskeletal: Negative.  Negative for arthralgias, back pain, joint swelling and neck pain.  Skin: Negative.  Negative for rash.  Allergic/Immunologic: Negative.   Neurological: Negative.  Negative for tremors and numbness.  Hematological: Negative for adenopathy. Does not bruise/bleed easily.  Psychiatric/Behavioral: Negative.  Negative for behavioral problems, sleep disturbance and suicidal ideas. The patient is not nervous/anxious.     Vital Signs: BP 126/77   Pulse 66   Temp 98.7 F (37.1 C)   Resp 16   Ht 6' (1.829 m)   Wt 272 lb (123.4 kg)   SpO2 96%   BMI 36.89 kg/m    Physical Exam Vitals and nursing note reviewed.  Constitutional:      General: He is not in acute distress.    Appearance: He is well-developed. He is not diaphoretic.  HENT:     Head: Normocephalic and atraumatic.     Mouth/Throat:     Pharynx: No oropharyngeal exudate.  Eyes:     Pupils: Pupils are equal, round, and reactive to light.  Neck:     Thyroid: No thyromegaly.     Vascular: No JVD.     Trachea: No tracheal deviation.  Cardiovascular:     Rate and Rhythm: Normal rate and regular rhythm.     Heart sounds: Normal heart sounds. No murmur. No friction rub. No gallop.    Pulmonary:     Effort: Pulmonary effort is normal. No respiratory distress.     Breath sounds: Normal breath sounds. No wheezing or rales.  Chest:     Chest wall: No tenderness.  Abdominal:     Palpations: Abdomen is soft.     Tenderness: There is no abdominal tenderness. There is no guarding.  Musculoskeletal:        General: Normal range of motion.     Cervical back: Normal range of motion and neck supple.  Lymphadenopathy:     Cervical: No cervical adenopathy.  Skin:    General:  Skin is warm and dry.  Neurological:     Mental Status: He is alert and oriented to person, place, and time.     Cranial Nerves: No cranial nerve deficit.  Psychiatric:        Behavior: Behavior normal.        Thought Content: Thought content normal.        Judgment: Judgment normal.    Assessment/Plan: 1. Hypertension secondary to other renal disorders Continue with current medications. Good control at this time.  - metoprolol succinate (TOPROL-XL) 50 MG 24 hr tablet; Take 1 tablet (50 mg total) by mouth daily. Take with or immediately following a meal.  Dispense: 90 tablet; Refill: 1 - lisinopril (ZESTRIL) 10 MG tablet; Take 1 tablet (10 mg total) by mouth daily.  Dispense: 90 tablet; Refill: 1 - lisinopril (ZESTRIL) 5 MG tablet; TAKE 1 TABLET BY MOUTH ONCE DAILY. TAKE A 5 MG AND 10 MG TABLET FOR TOTAL OF 15 MG.  Dispense: 90 tablet; Refill: 1  2. Intermittent self-catheterization of bladder Pt will continue to see [facility] for further management.   3. CKD (chronic kidney disease) stage 1, GFR 90 ml/min or greater Stable, continue to monitor.  4. BMI 36.0-36.9,adult Comorbidities include HTN, and CKD.  General Counseling: sadik piascik understanding of the findings of todays visit and agrees with plan of treatment. I have discussed any further diagnostic evaluation that may be needed or ordered today. We also reviewed his medications today. he has been encouraged to call the office with any  questions or concerns that should arise related to todays visit.    No orders of the defined types were placed in this encounter.   Meds ordered this encounter  Medications  . metoprolol succinate (TOPROL-XL) 50 MG 24 hr tablet    Sig: Take 1 tablet (50 mg total) by mouth daily. Take with or immediately following a meal.    Dispense:  90 tablet    Refill:  1  . lisinopril (ZESTRIL) 10 MG tablet    Sig: Take 1 tablet (10 mg total) by mouth daily.    Dispense:  90 tablet    Refill:  1    PLEASE provide 90 day supply. Patient should take 15 mg total, one 5 mg and one 10 mg daily.  Marland Kitchen lisinopril (ZESTRIL) 5 MG tablet    Sig: TAKE 1 TABLET BY MOUTH ONCE DAILY. TAKE A 5 MG AND 10 MG TABLET FOR TOTAL OF 15 MG.    Dispense:  90 tablet    Refill:  1    PLEASE provide 90 day supply.    Time spent: 30 Minutes   This patient was seen by Blima Ledger AGNP-C in Collaboration with Dr Lyndon Code as a part of collaborative care agreement     Johnna Acosta AGNP-C Internal medicine

## 2020-06-04 ENCOUNTER — Emergency Department
Admission: EM | Admit: 2020-06-04 | Discharge: 2020-06-04 | Disposition: A | Discharge status: Home / Self Care | Payer: Medicaid Other | Attending: Emergency Medicine | Admitting: Emergency Medicine

## 2020-06-04 ENCOUNTER — Other Ambulatory Visit: Payer: Self-pay

## 2020-06-04 DIAGNOSIS — K222 Esophageal obstruction: Secondary | ICD-10-CM

## 2020-06-04 DIAGNOSIS — I129 Hypertensive chronic kidney disease with stage 1 through stage 4 chronic kidney disease, or unspecified chronic kidney disease: Secondary | ICD-10-CM | POA: Diagnosis not present

## 2020-06-04 DIAGNOSIS — Z87891 Personal history of nicotine dependence: Secondary | ICD-10-CM | POA: Diagnosis not present

## 2020-06-04 DIAGNOSIS — Y999 Unspecified external cause status: Secondary | ICD-10-CM | POA: Diagnosis not present

## 2020-06-04 DIAGNOSIS — Y929 Unspecified place or not applicable: Secondary | ICD-10-CM | POA: Insufficient documentation

## 2020-06-04 DIAGNOSIS — N181 Chronic kidney disease, stage 1: Secondary | ICD-10-CM | POA: Diagnosis not present

## 2020-06-04 DIAGNOSIS — Y939 Activity, unspecified: Secondary | ICD-10-CM | POA: Insufficient documentation

## 2020-06-04 DIAGNOSIS — Z79899 Other long term (current) drug therapy: Secondary | ICD-10-CM | POA: Diagnosis not present

## 2020-06-04 DIAGNOSIS — X58XXXA Exposure to other specified factors, initial encounter: Secondary | ICD-10-CM | POA: Insufficient documentation

## 2020-06-04 DIAGNOSIS — T18128A Food in esophagus causing other injury, initial encounter: Secondary | ICD-10-CM

## 2020-06-04 MED ORDER — GLUCAGON HCL RDNA (DIAGNOSTIC) 1 MG IJ SOLR
1.0000 mg | Freq: Once | INTRAMUSCULAR | Status: AC
  Administered 2020-06-04: 1 mg via INTRAVENOUS
  Filled 2020-06-04: qty 1

## 2020-06-04 NOTE — ED Notes (Signed)
Pt states the food bolus has passed

## 2020-06-04 NOTE — ED Triage Notes (Signed)
Pt states he believes he has a piece of steak stuck in his "throat". Pt is able tos peak clearly, no resp distress, but cannot swallow saliva.

## 2020-06-04 NOTE — ED Provider Notes (Signed)
[facility] Emergency Department Provider Note   ____________________________________________    I have reviewed the triage vital signs and the nursing notes.   HISTORY  Chief Complaint food bolus     HPI Austin Stevenson is a 24 y.o. male with a history as noted below who presents with complaints of a food bolus.  Patient reports he was eating dinner today, steak piece got caught in his esophagus, unable to tolerate liquids, is spitting up his saliva.  Has been to dislodge it.  Reports this is happened to him several times in the past.  Has not followed up with GI.  No other complaints today.  Past Medical History:  Diagnosis Date  . Atrophic kidney   . Chronic kidney disease    stage 1  . Hypertension   . Obesity   . 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 Urethral fistula     Patient Active Problem List   Diagnosis Date Noted  . Urinary tract infection with hematuria 05/26/2019  . Flank pain, acute 05/26/2019  . Dysuria 05/26/2019  . Fistula, urethral 08/28/2018  . Hypertension 08/28/2018  . Atrophic kidney, acquired 08/28/2018  . Elevated ALT measurement 03/11/2018  . CKD (chronic kidney disease) stage 1, GFR 90 ml/min or greater 12/05/2017  . Food impaction of esophagus   . History of recurrent UTI (urinary tract infection) 08/14/2015  . Pyelonephritis 06/26/2015  . SIRS (systemic inflammatory response syndrome) (HCC) 06/26/2015  . Difficulty managing urinary catheter 06/05/2015  . Intermittent self-catheterization of bladder 06/05/2015  . Status post appendico-vesicostomy (HCC) 06/05/2015  . Proteinuria 01/17/2013    Past Surgical History:  Procedure Laterality Date  . ABDOMINAL SURGERY    . ABSCESS DRAINAGE  11/15/2005  . creation of cathetrizable stoma    . cystopic catheter placement   11/0/2010  . CYSTOURETHROSCOPY    . dilation of stricture  09/12/2008  . ESOPHAGOGASTRODUODENOSCOPY (EGD) WITH PROPOFOL N/A 11/25/2016     Procedure: ESOPHAGOGASTRODUODENOSCOPY (EGD) WITH PROPOFOL;  Surgeon: Midge Minium, MD;  Location: [facility] ENDOSCOPY;  Service: Endoscopy;  Laterality: N/A;  . MITROFANOFF PROCEDURE  09/10/2005  . SUPRAPUBIC CATHETER PLACEMENT  09/28/2005  . TRANSRECTAL DRAINAGE OF PELVIC ABSCESS    . URETHRAL DILATION      Prior to Admission medications   Medication Sig Start Date End Date Taking? Authorizing Provider  lisinopril (ZESTRIL) 10 MG tablet Take 1 tablet (10 mg total) by mouth daily. 03/16/20   Scarboro, Coralee North, NP  lisinopril (ZESTRIL) 5 MG tablet TAKE 1 TABLET BY MOUTH ONCE DAILY. TAKE A 5 MG AND 10 MG TABLET FOR TOTAL OF 15 MG. 03/16/20   Scarboro, Coralee North, NP  metoprolol succinate (TOPROL-XL) 50 MG 24 hr tablet Take 1 tablet (50 mg total) by mouth daily. Take with or immediately following a meal. 03/16/20   Scarboro, Coralee North, NP     Allergies Patient has no known allergies.  Family History  Problem Relation Age of Onset  . Diabetes Mother   . Hypertension Paternal Grandfather     Social History Social History   Tobacco Use  . Smoking status: Former Smoker    Types: Cigars  . Smokeless tobacco: Never Used  Vaping Use  . Vaping Use: Every day  . Devices: e cigarettes   Substance Use Topics  . Alcohol use: No  . Drug use: No    Review of Systems    ENT: No difficulty breathing Cardiovascular: Denies chest pain. Respiratory: Denies shortness  of breath. Gastrointestinal: As above  Musculoskeletal: Negative for back pain.     ____________________________________________   PHYSICAL EXAM:  VITAL SIGNS: ED Triage Vitals  Enc Vitals Group     BP 06/04/20 2048 (!) 138/91     Pulse Rate 06/04/20 2048 (!) 120     Resp 06/04/20 2048 20     Temp 06/04/20 2147 98.1 F (36.7 C)     Temp src --      SpO2 06/04/20 2048 96 %     Weight 06/04/20 2049 126.6 kg (279 lb)     Height 06/04/20 2049 1.829 m (6')     Head Circumference --      Peak Flow --      Pain Score 06/04/20  2048 7     Pain Loc --      Pain Edu? --      Excl. in [place]? --     Constitutional: Alert and oriented.  Eyes: Conjunctivae are normal.   Nose: No congestion/rhinnorhea. Mouth/Throat: Mucous membranes are moist.  Normal pharynx, no stridor Neck:  Painless ROM Cardiovascular: Normal rate, regular rhythm.  Good peripheral circulation. Respiratory: Normal respiratory effort.  No retractions Gastrointestinal: Soft and nontender. No distention.    Musculoskeletal:  Warm and well perfused Neurologic:  Normal speech and language. No gross focal neurologic deficits are appreciated.  Skin:  Skin is warm, dry and intact. No rash noted. Psychiatric: Mood and affect are normal. Speech and behavior are normal.  ____________________________________________   LABS (all labs ordered are listed, but only abnormal results are displayed)  Labs Reviewed - No data to display ____________________________________________  EKG  None ____________________________________________  RADIOLOGY   ____________________________________________   PROCEDURES  Procedure(s) performed: No  Procedures   Critical Care performed: No ____________________________________________   INITIAL IMPRESSION / ASSESSMENT AND PLAN / ED COURSE  Pertinent labs & imaging results that were available during my care of the patient were reviewed by me and considered in my medical decision making (see chart for details).  HPI consistent with esophageal food bolus.  I had patient drink soda and jump on his heels at the same time which dislodge the food bolus.  He was able to tolerate p.o. afterwards.  No GI consultation needed at this time, recommended outpatient GI follow-up.    ____________________________________________   FINAL CLINICAL IMPRESSION(S) / ED DIAGNOSES  Final diagnoses:  Esophageal obstruction due to food impaction        Note:  This document was prepared using Dragon voice recognition  software and may include unintentional dictation errors.   Lavonia Drafts, MD 06/04/20 2256

## 2020-07-13 ENCOUNTER — Telehealth: Payer: Self-pay

## 2020-07-13 NOTE — Telephone Encounter (Signed)
Called lmom informing patient of appointment on 07/17/2020. klh 

## 2020-07-13 NOTE — Telephone Encounter (Signed)
Confirmed and screened for 07-17-20 ov. °

## 2020-07-17 ENCOUNTER — Other Ambulatory Visit: Payer: Self-pay

## 2020-07-17 ENCOUNTER — Ambulatory Visit: Payer: Medicaid Other | Admitting: Adult Health

## 2020-07-17 ENCOUNTER — Encounter: Payer: Self-pay | Admitting: Adult Health

## 2020-07-17 VITALS — BP 108/68 | HR 108 | Temp 97.8°F | Resp 16 | Ht 72.0 in | Wt 270.2 lb

## 2020-07-17 DIAGNOSIS — Z0001 Encounter for general adult medical examination with abnormal findings: Secondary | ICD-10-CM | POA: Diagnosis not present

## 2020-07-17 DIAGNOSIS — Z6836 Body mass index (BMI) 36.0-36.9, adult: Secondary | ICD-10-CM | POA: Diagnosis not present

## 2020-07-17 DIAGNOSIS — N181 Chronic kidney disease, stage 1: Secondary | ICD-10-CM | POA: Diagnosis not present

## 2020-07-17 DIAGNOSIS — N2889 Other specified disorders of kidney and ureter: Secondary | ICD-10-CM

## 2020-07-17 DIAGNOSIS — I151 Hypertension secondary to other renal disorders: Secondary | ICD-10-CM | POA: Diagnosis not present

## 2020-07-17 NOTE — Progress Notes (Signed)
[facility] [address] [place], [place] [address]  Internal MEDICINE  Office Visit Note  Patient Name: Austin Stevenson  [id number]  [id number]  Date of Service: 07/17/2020  Chief Complaint  Patient presents with  . Hypertension    4 month f-up    HPI  Pt is here for follow up. Overall he is doing well.  He is down to 270 lbs.  He was over last fall.  He is currently on keto diet, and is doing well with this.  He does know that with his kidney issues this is not he ideal diet for him.  It is the only thing that works for him at this time.  He Does have an elevated heart rate today, it is 108 bpm.  He did just leave the gym.    Current Medication: Outpatient Encounter Medications as of 07/17/2020  Medication Sig  . lisinopril (ZESTRIL) 10 MG tablet Take 1 tablet (10 mg total) by mouth daily.  Marland Kitchen lisinopril (ZESTRIL) 5 MG tablet TAKE 1 TABLET BY MOUTH ONCE DAILY. TAKE A 5 MG AND 10 MG TABLET FOR TOTAL OF 15 MG.  . metoprolol succinate (TOPROL-XL) 50 MG 24 hr tablet Take 1 tablet (50 mg total) by mouth daily. Take with or immediately following a meal.   No facility-administered encounter medications on file as of 07/17/2020.    Surgical History: Past Surgical History:  Procedure Laterality Date  . ABDOMINAL SURGERY    . ABSCESS DRAINAGE  11/15/2005  . creation of cathetrizable stoma    . cystopic catheter placement   11/0/2010  . CYSTOURETHROSCOPY    . dilation of stricture  09/12/2008  . ESOPHAGOGASTRODUODENOSCOPY (EGD) WITH PROPOFOL N/A 11/25/2016   Procedure: ESOPHAGOGASTRODUODENOSCOPY (EGD) WITH PROPOFOL;  Surgeon: Midge Minium, MD;  Location: [facility] ENDOSCOPY;  Service: Endoscopy;  Laterality: N/A;  . MITROFANOFF PROCEDURE  09/10/2005  . SUPRAPUBIC CATHETER PLACEMENT  09/28/2005  . TRANSRECTAL DRAINAGE OF PELVIC ABSCESS    . URETHRAL DILATION      Medical History: Past Medical History:  Diagnosis Date  . Atrophic kidney   . Chronic kidney disease    stage 1   . Hypertension   . Obesity   . 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 Urethral fistula     Family History: Family History  Problem Relation Age of Onset  . Diabetes Mother   . Hypertension Paternal Grandfather     Social History   Socioeconomic History  . Marital status: Single    Spouse name: Not on file  . Number of children: Not on file  . Years of education: Not on file  . Highest education level: Not on file  Occupational History  . Not on file  Tobacco Use  . Smoking status: Former Smoker    Types: Cigars  . Smokeless tobacco: Never Used  Vaping Use  . Vaping Use: Every day  . Devices: e cigarettes   Substance and Sexual Activity  . Alcohol use: No  . Drug use: No  . Sexual activity: Yes  Other Topics Concern  . Not on file  Social History Narrative  . Not on file   Social Determinants of Health   Financial Resource Strain:   . Difficulty of Paying Living Expenses:   Food Insecurity:   . Worried About Programme researcher, broadcasting/film/video in the Last Year:   . Barista in the Last Year:   Transportation Needs:   . Freight forwarder (Medical):   Marland Kitchen  Lack of Transportation (Non-Medical):   Physical Activity:   . Days of Exercise per Week:   . Minutes of Exercise per Session:   Stress:   . Feeling of Stress :   Social Connections:   . Frequency of Communication with Friends and Family:   . Frequency of Social Gatherings with Friends and Family:   . Attends Religious Services:   . Active Member of Clubs or Organizations:   . Attends Banker Meetings:   Marland Kitchen Marital Status:   Intimate Partner Violence:   . Fear of Current or Ex-Partner:   . Emotionally Abused:   Marland Kitchen Physically Abused:   . Sexually Abused:       Review of Systems  Constitutional: Negative.  Negative for chills, fatigue and unexpected weight change.  HENT: Negative.  Negative for congestion, rhinorrhea, sneezing and sore throat.   Eyes: Negative for redness.  Respiratory:  Negative.  Negative for cough, chest tightness and shortness of breath.   Cardiovascular: Negative.  Negative for chest pain and palpitations.  Gastrointestinal: Negative.  Negative for abdominal pain, constipation, diarrhea, nausea and vomiting.  Endocrine: Negative.   Genitourinary: Negative.  Negative for dysuria and frequency.  Musculoskeletal: Negative.  Negative for arthralgias, back pain, joint swelling and neck pain.  Skin: Negative.  Negative for rash.  Allergic/Immunologic: Negative.   Neurological: Negative.  Negative for tremors and numbness.  Hematological: Negative for adenopathy. Does not bruise/bleed easily.  Psychiatric/Behavioral: Negative.  Negative for behavioral problems, sleep disturbance and suicidal ideas. The patient is not nervous/anxious.     Vital Signs: BP 108/68   Pulse (!) 108   Temp 97.8 F (36.6 C)   Resp 16   Ht 6' (1.829 m)   Wt 270 lb 3.2 oz (122.6 kg)   SpO2 98%   BMI 36.65 kg/m    Physical Exam Vitals and nursing note reviewed.  Constitutional:      General: He is not in acute distress.    Appearance: He is well-developed. He is not diaphoretic.  HENT:     Head: Normocephalic and atraumatic.     Mouth/Throat:     Pharynx: No oropharyngeal exudate.  Eyes:     Pupils: Pupils are equal, round, and reactive to light.  Neck:     Thyroid: No thyromegaly.     Vascular: No JVD.     Trachea: No tracheal deviation.  Cardiovascular:     Rate and Rhythm: Normal rate and regular rhythm.     Heart sounds: Normal heart sounds. No murmur heard.  No friction rub. No gallop.   Pulmonary:     Effort: Pulmonary effort is normal. No respiratory distress.     Breath sounds: Normal breath sounds. No wheezing or rales.  Chest:     Chest wall: No tenderness.  Abdominal:     Palpations: Abdomen is soft.     Tenderness: There is no abdominal tenderness. There is no guarding.  Musculoskeletal:        General: Normal range of motion.     Cervical back:  Normal range of motion and neck supple.  Lymphadenopathy:     Cervical: No cervical adenopathy.  Skin:    General: Skin is warm and dry.  Neurological:     Mental Status: He is alert and oriented to person, place, and time.     Cranial Nerves: No cranial nerve deficit.  Psychiatric:        Behavior: Behavior normal.  Thought Content: Thought content normal.        Judgment: Judgment normal.    Assessment/Plan: 1. Hypertension secondary to other renal disorders Continue present management.   2. CKD (chronic kidney disease) stage 1, GFR 90 ml/min or greater Recheck kidney function.  3. BMI 36.0-36.9,adult Obesity Counseling: Risk Assessment: An assessment of behavioral risk factors was made today and includes lack of exercise sedentary lifestyle, lack of portion control and poor dietary habits.  Risk Modification Advice: She was counseled on portion control guidelines. Restricting daily caloric intake to 1600. The detrimental long term effects of obesity on her health and ongoing poor compliance was also discussed with the patient.   4. Encounter for general adult medical examination with abnormal findings Have labs drawn for physical at next visit.  - CBC with Differential/Platelet - Lipid Panel With LDL/HDL Ratio - TSH - T4, free - Comprehensive metabolic panel  General Counseling: Sherrell verbalizes understanding of the findings of todays visit and agrees with plan of treatment. I have discussed any further diagnostic evaluation that may be needed or ordered today. We also reviewed his medications today. he has been encouraged to call the office with any questions or concerns that should arise related to todays visit.    Orders Placed This Encounter  Procedures  . CBC with Differential/Platelet  . Lipid Panel With LDL/HDL Ratio  . TSH  . T4, free  . Comprehensive metabolic panel    No orders of the defined types were placed in this encounter.   Time spent: 30  Minutes   This patient was seen by Blima Ledger AGNP-C in Collaboration with Dr Lyndon Code as a part of collaborative care agreement     Johnna Acosta AGNP-C Internal medicine

## 2020-07-19 LAB — COMPREHENSIVE METABOLIC PANEL
ALT: 18 IU/L (ref 0–44)
AST: 19 IU/L (ref 0–40)
Albumin/Globulin Ratio: 1.4 (ref 1.2–2.2)
Albumin: 4.9 g/dL (ref 4.1–5.2)
Alkaline Phosphatase: 93 IU/L (ref 48–121)
BUN/Creatinine Ratio: 17 (ref 9–20)
BUN: 25 mg/dL — ABNORMAL HIGH (ref 6–20)
Bilirubin Total: 0.9 mg/dL (ref 0.0–1.2)
CO2: 22 mmol/L (ref 20–29)
Calcium: 10.1 mg/dL (ref 8.7–10.2)
Chloride: 102 mmol/L (ref 96–106)
Creatinine, Ser: 1.47 mg/dL — ABNORMAL HIGH (ref 0.76–1.27)
GFR calc Af Amer: 77 mL/min/{1.73_m2} (ref >59)
GFR calc non Af Amer: 67 mL/min/{1.73_m2} (ref >59)
Globulin, Total: 3.6 g/dL (ref 1.5–4.5)
Glucose: 85 mg/dL (ref 65–99)
Potassium: 5.1 mmol/L (ref 3.5–5.2)
Sodium: 137 mmol/L (ref 134–144)
Total Protein: 8.5 g/dL (ref 6.0–8.5)

## 2020-07-19 LAB — CBC WITH DIFFERENTIAL/PLATELET
Basophils Absolute: 0 10*3/uL (ref 0.0–0.2)
Basos: 1 %
EOS (ABSOLUTE): 0.2 10*3/uL (ref 0.0–0.4)
Eos: 4 %
Hematocrit: 46.4 % (ref 37.5–51.0)
Hemoglobin: 15.4 g/dL (ref 13.0–17.7)
Immature Grans (Abs): 0 10*3/uL (ref 0.0–0.1)
Immature Granulocytes: 1 %
Lymphocytes Absolute: 1.8 10*3/uL (ref 0.7–3.1)
Lymphs: 29 %
MCH: 27.5 pg (ref 26.6–33.0)
MCHC: 33.2 g/dL (ref 31.5–35.7)
MCV: 83 fL (ref 79–97)
Monocytes Absolute: 0.8 10*3/uL (ref 0.1–0.9)
Monocytes: 12 %
Neutrophils Absolute: 3.3 10*3/uL (ref 1.4–7.0)
Neutrophils: 53 %
Platelets: 248 10*3/uL (ref 150–450)
RBC: 5.6 x10E6/uL (ref 4.14–5.80)
RDW: 14.3 % (ref 11.6–15.4)
WBC: 6.1 10*3/uL (ref 3.4–10.8)

## 2020-07-19 LAB — LIPID PANEL WITH LDL/HDL RATIO
Cholesterol, Total: 158 mg/dL (ref 100–199)
HDL: 41 mg/dL (ref >39)
LDL Chol Calc (NIH): 100 mg/dL — ABNORMAL HIGH (ref 0–99)
LDL/HDL Ratio: 2.4 ratio (ref 0.0–3.6)
Triglycerides: 93 mg/dL (ref 0–149)
VLDL Cholesterol Cal: 17 mg/dL (ref 5–40)

## 2020-07-19 LAB — TSH: TSH: 2.14 u[IU]/mL (ref 0.450–4.500)

## 2020-07-19 LAB — T4, FREE: Free T4: 1.57 ng/dL (ref 0.82–1.77)

## 2020-08-02 ENCOUNTER — Encounter: Payer: Medicaid Other | Admitting: Adult Health

## 2020-08-10 ENCOUNTER — Telehealth: Payer: Self-pay

## 2020-08-10 NOTE — Telephone Encounter (Signed)
confirmed office visit on 8/31 

## 2020-08-14 ENCOUNTER — Encounter: Payer: Medicaid Other | Admitting: Adult Health

## 2020-08-15 ENCOUNTER — Other Ambulatory Visit: Payer: Self-pay

## 2020-08-15 ENCOUNTER — Encounter: Payer: Self-pay | Admitting: Hospice and Palliative Medicine

## 2020-08-15 ENCOUNTER — Ambulatory Visit (INDEPENDENT_AMBULATORY_CARE_PROVIDER_SITE_OTHER): Payer: Medicaid Other | Admitting: Hospice and Palliative Medicine

## 2020-08-15 DIAGNOSIS — Z0001 Encounter for general adult medical examination with abnormal findings: Secondary | ICD-10-CM | POA: Diagnosis not present

## 2020-08-15 DIAGNOSIS — N2889 Other specified disorders of kidney and ureter: Secondary | ICD-10-CM

## 2020-08-15 DIAGNOSIS — Z23 Encounter for immunization: Secondary | ICD-10-CM | POA: Diagnosis not present

## 2020-08-15 DIAGNOSIS — N181 Chronic kidney disease, stage 1: Secondary | ICD-10-CM | POA: Diagnosis not present

## 2020-08-15 DIAGNOSIS — I151 Hypertension secondary to other renal disorders: Secondary | ICD-10-CM | POA: Diagnosis not present

## 2020-08-15 DIAGNOSIS — R300 Dysuria: Secondary | ICD-10-CM

## 2020-08-15 DIAGNOSIS — N360 Urethral fistula: Secondary | ICD-10-CM

## 2020-08-15 NOTE — Progress Notes (Signed)
[facility] [place],  [address]  Internal MEDICINE  Office Visit Note  Patient Name: Austin Stevenson  [id number]  [id number]  Date of Service: 08/16/2020  Chief Complaint  Patient presents with  . Annual Exam    review blood work, pt wants flu shot  . Hypertension  . Quality Metric Gaps    HepC, HIV screening, TDAP     HPI Pt is here for routine health maintenance examination 1. Suprapubic catheter-due to history of pelvic fracture with urethral stricture (2006). He is scheduled to have urethroplasty 9/23 in hopes to have catheter removed. Closely followed and monitored by pediatric nephrology at [facility]. Earlier this month had a catheter exchange with no complication. 2. Reviewed blood work with him. Creatinine is stable compared to labs collected last month by nephrology. They continue to closely monitor. 3. Discussed his slightly elevated LDL cholesterol. Requesting influenza vaccine today. No acute complaints Anxious about his upcoming surgery. His nephrologist has told him there is a 50-50 chance of the surgery being successful.  Current Medication: Outpatient Encounter Medications as of 08/15/2020  Medication Sig  . lisinopril (ZESTRIL) 10 MG tablet Take 1 tablet (10 mg total) by mouth daily.  . metoprolol succinate (TOPROL-XL) 50 MG 24 hr tablet Take 1 tablet (50 mg total) by mouth daily. Take with or immediately following a meal.  . lisinopril (ZESTRIL) 5 MG tablet TAKE 1 TABLET BY MOUTH ONCE DAILY. TAKE A 5 MG AND 10 MG TABLET FOR TOTAL OF 15 MG. (Patient not taking: Reported on 08/15/2020)   No facility-administered encounter medications on file as of 08/15/2020.    Surgical History: Past Surgical History:  Procedure Laterality Date  . ABDOMINAL SURGERY    . ABSCESS DRAINAGE  11/15/2005  . creation of cathetrizable stoma    . cystopic catheter placement   11/0/2010  . CYSTOURETHROSCOPY    . dilation of stricture  09/12/2008  .  ESOPHAGOGASTRODUODENOSCOPY (EGD) WITH PROPOFOL N/A 11/25/2016   Procedure: ESOPHAGOGASTRODUODENOSCOPY (EGD) WITH PROPOFOL;  Surgeon: Lucilla Lame, MD;  Location: [facility] ENDOSCOPY;  Service: Endoscopy;  Laterality: N/A;  . MITROFANOFF PROCEDURE  09/10/2005  . SUPRAPUBIC CATHETER PLACEMENT  09/28/2005  . TRANSRECTAL DRAINAGE OF PELVIC ABSCESS    . URETHRAL DILATION      Medical History: Past Medical History:  Diagnosis Date  . Atrophic kidney   . Chronic kidney disease    stage 1  . Hypertension   . Obesity   . 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 Urethral fistula     Family History: Family History  Problem Relation Age of Onset  . Diabetes Mother   . Hypertension Paternal Grandfather     Review of Systems  Constitutional: Negative for chills, diaphoresis, fatigue and fever.  HENT: Negative for congestion, rhinorrhea, sinus pressure, sneezing and sore throat.   Eyes: Negative for photophobia and visual disturbance.  Respiratory: Negative for cough, chest tightness and shortness of breath.   Cardiovascular: Negative for chest pain, palpitations and leg swelling.  Gastrointestinal: Negative for abdominal pain, constipation, diarrhea, nausea and vomiting.  Genitourinary: Negative for dysuria and frequency.       Suprapubic catheter  Musculoskeletal: Negative for arthralgias, back pain, joint swelling and neck pain.  Skin: Negative for rash.  Neurological: Negative.  Negative for tremors, numbness and headaches.  Hematological: Negative for adenopathy. Does not bruise/bleed easily.  Psychiatric/Behavioral: Negative for behavioral problems (Depression), sleep disturbance and suicidal ideas. The patient is not nervous/anxious.  Vital Signs: BP 118/62   Pulse 77   Temp (!) 97.1 F (36.2 C)   Resp 16   Ht 6' (1.829 m)   Wt 260 lb 6.4 oz (118.1 kg)   SpO2 98%   BMI 35.32 kg/m    Physical Exam Constitutional:      Appearance: Normal appearance.  HENT:     Nose:  Nose normal.     Mouth/Throat:     Mouth: Mucous membranes are moist.     Pharynx: Oropharynx is clear.  Cardiovascular:     Rate and Rhythm: Normal rate and regular rhythm.     Pulses: Normal pulses.     Heart sounds: Normal heart sounds.  Pulmonary:     Effort: Pulmonary effort is normal.     Breath sounds: Normal breath sounds.  Abdominal:     General: Abdomen is flat.     Palpations: Abdomen is soft.  Musculoskeletal:        General: Normal range of motion.     Cervical back: Normal range of motion.  Skin:    General: Skin is warm.  Neurological:     General: No focal deficit present.     Mental Status: He is alert and oriented to person, place, and time. Mental status is at baseline.  Psychiatric:        Mood and Affect: Mood normal.        Behavior: Behavior normal.        Thought Content: Thought content normal.      LABS: Recent Results (from the past 2160 hour(s))  CBC with Differential/Platelet     Status: None   Collection Time: 07/18/20  3:15 PM  Result Value Ref Range   WBC 6.1 3.4 - 10.8 x10E3/uL   RBC 5.60 4.14 - 5.80 x10E6/uL   Hemoglobin 15.4 13.0 - 17.7 g/dL   Hematocrit 46.4 37.5 - 51.0 %   MCV 83 79 - 97 fL   MCH 27.5 26.6 - 33.0 pg   MCHC 33.2 31 - 35 g/dL   RDW 14.3 11.6 - 15.4 %   Platelets 248 150 - 450 x10E3/uL   Neutrophils 53 Not Estab. %   Lymphs 29 Not Estab. %   Monocytes 12 Not Estab. %   Eos 4 Not Estab. %   Basos 1 Not Estab. %   Neutrophils Absolute 3.3 1 - 7 x10E3/uL   Lymphocytes Absolute 1.8 0 - 3 x10E3/uL   Monocytes Absolute 0.8 0 - 0 x10E3/uL   EOS (ABSOLUTE) 0.2 0.0 - 0.4 x10E3/uL   Basophils Absolute 0.0 0 - 0 x10E3/uL   Immature Granulocytes 1 Not Estab. %   Immature Grans (Abs) 0.0 0.0 - 0.1 x10E3/uL  Lipid Panel With LDL/HDL Ratio     Status: Abnormal   Collection Time: 07/18/20  3:15 PM  Result Value Ref Range   Cholesterol, Total 158 100 - 199 mg/dL   Triglycerides 93 0 - 149 mg/dL   HDL 41 >39 mg/dL   VLDL  Cholesterol Cal 17 5 - 40 mg/dL   LDL Chol Calc (NIH) 100 (H) 0 - 99 mg/dL   LDL/HDL Ratio 2.4 0.0 - 3.6 ratio    Comment:                                     LDL/HDL Ratio  Men  Women                               1/2 Avg.Risk  1.0    1.5                                   Avg.Risk  3.6    3.2                                2X Avg.Risk  6.2    5.0                                3X Avg.Risk  8.0    6.1   TSH     Status: None   Collection Time: 07/18/20  3:15 PM  Result Value Ref Range   TSH 2.140 0.450 - 4.500 uIU/mL  T4, free     Status: None   Collection Time: 07/18/20  3:15 PM  Result Value Ref Range   Free T4 1.57 0.82 - 1.77 ng/dL  Comprehensive metabolic panel     Status: Abnormal   Collection Time: 07/18/20  3:15 PM  Result Value Ref Range   Glucose 85 65 - 99 mg/dL   BUN 25 (H) 6 - 20 mg/dL   Creatinine, Ser 1.47 (H) 0.76 - 1.27 mg/dL   GFR calc non Af Amer 67 >59 mL/min/1.73   GFR calc Af Amer 77 >59 mL/min/1.73    Comment: **[facility] currently reports eGFR in compliance with the current**   recommendations of the [employer]. [facility] will   update reporting as new guidelines are published from the NKF-ASN   Task force.    BUN/Creatinine Ratio 17 9 - 20   Sodium 137 134 - 144 mmol/L   Potassium 5.1 3.5 - 5.2 mmol/L   Chloride 102 96 - 106 mmol/L   CO2 22 20 - 29 mmol/L   Calcium 10.1 8.7 - 10.2 mg/dL   Total Protein 8.5 6.0 - 8.5 g/dL   Albumin 4.9 4.1 - 5.2 g/dL   Globulin, Total 3.6 1.5 - 4.5 g/dL   Albumin/Globulin Ratio 1.4 1.2 - 2.2   Bilirubin Total 0.9 0.0 - 1.2 mg/dL   Alkaline Phosphatase 93 48 - 121 IU/L   AST 19 0 - 40 IU/L   ALT 18 0 - 44 IU/L  UA/M w/rflx Culture, Routine     Status: Abnormal (Preliminary result)   Collection Time: 08/15/20 12:08 PM   Specimen: Urine   Urine  Result Value Ref Range   Specific Gravity, UA 1.015 1.005 - 1.030   pH, UA 7.0 5.0 - 7.5   Color, UA Yellow Yellow    Appearance Ur Cloudy (A) Clear   Leukocytes,UA 3+ (A) Negative   Protein,UA 1+ (A) Negative/Trace   Glucose, UA Negative Negative   Ketones, UA Negative Negative   RBC, UA 1+ (A) Negative   Bilirubin, UA Negative Negative   Urobilinogen, Ur 0.2 0.2 - 1.0 mg/dL   Nitrite, UA Negative Negative   Microscopic Examination See below:     Comment: Microscopic was indicated and was performed.   Urinalysis Reflex Comment     Comment: This specimen has reflexed to a Urine Culture.  Microscopic Examination  Status: Abnormal   Collection Time: 08/15/20 12:08 PM   Urine  Result Value Ref Range   WBC, UA Comment (A) 0 - 5 /hpf    Comment: Clumps of leukocytes present.   RBC None seen 0 - 2 /hpf   Epithelial Cells (non renal) None seen 0 - 10 /hpf   Casts Present (A) None seen /lpf   Cast Type Hyaline casts N/A   Bacteria, UA None seen None seen/Few  Urine Culture, Reflex     Status: None (Preliminary result)   Collection Time: 08/15/20 12:08 PM   Urine  Result Value Ref Range   Urine Culture, Routine WILL FOLLOW    Assessment/Plan: 1. Encounter for general adult medical examination with abnormal findings Well appearing 24 year old male. Up to date on PHM. Decline testicular exam today for cancer screening. Adhering to self testicular exams monthly. Labs reviewed, stable at this time.  2. CKD (chronic kidney disease) stage 1, GFR 90 ml/min or greater Kidney functions lab remain stable compared to values reviewed by nephrologist. Closely followed by [facility] pediatric nephrology.  3. Hypertension secondary to other renal disorders BP and HR well controlled today, continue with current therapy and continue monitoring. Had an elevation in HR at last visit, much improved today. He has not noticed since last visit that his HR has been elevated. Will continue close monitoring.  4. Fistula, urethral Scheduled to have urethroplasty later this month. Plan for reversal of suprapubic catheter. Had  catheter exchange in July, reports no complications.  5. Flu vaccine need - Flu Vaccine MDCK QUAD PF  6. Dysuria - UA/M w/rflx Culture, Routine - Microscopic Examination - Urine Culture, Reflex  General Counseling: Mosi verbalizes understanding of the findings of todays visit and agrees with plan of treatment. I have discussed any further diagnostic evaluation that may be needed or ordered today. We also reviewed his medications today. he has been encouraged to call the office with any questions or concerns that should arise related to todays visit.    Counseling: Hypertension Counseling:  The following hypertensive lifestyle modification were recommended and discussed:  1. Limiting alcohol intake to less than 1 oz/day of ethanol:(24 oz of beer or 8 oz of wine or 2 oz of 100-proof whiskey). 2. Take baby ASA 81 mg daily. 3. Importance of regular aerobic exercise and losing weight. 4. Reduce dietary saturated fat and cholesterol intake for overall cardiovascular health. 5. Maintaining adequate dietary potassium, calcium, and magnesium intake. 6. Regular monitoring of the blood pressure. 7. Reduce sodium intake to less than 100 mmol/day (less than 2.3 gm of sodium or less than 6 gm of sodium choride)     Orders Placed This Encounter  Procedures  . Microscopic Examination  . Urine Culture, Reflex  . Flu Vaccine MDCK QUAD PF  . UA/M w/rflx Culture, Routine   Total time spent: 30 Minutes  Time spent includes review of chart, medications, test results, and follow up plan with the patient.   This patient was seen by Casey Burkitt AGNP-C Collaboration with Dr Lavera Guise as a part of collaborative care agreement   Tanna Furry. [facility] Internal Medicine

## 2020-08-18 LAB — MICROSCOPIC EXAMINATION
Bacteria, UA: NONE SEEN
Epithelial Cells (non renal): NONE SEEN /hpf (ref 0–10)
RBC, Urine: NONE SEEN /hpf (ref 0–2)

## 2020-08-18 LAB — UA/M W/RFLX CULTURE, ROUTINE
Bilirubin, UA: NEGATIVE
Glucose, UA: NEGATIVE
Ketones, UA: NEGATIVE
Nitrite, UA: NEGATIVE
Specific Gravity, UA: 1.015 (ref 1.005–1.030)
Urobilinogen, Ur: 0.2 mg/dL (ref 0.2–1.0)
pH, UA: 7 (ref 5.0–7.5)

## 2020-08-18 LAB — URINE CULTURE, REFLEX

## 2020-11-07 ENCOUNTER — Ambulatory Visit: Payer: Medicaid Other | Admitting: Hospice and Palliative Medicine

## 2020-11-07 ENCOUNTER — Telehealth: Payer: Self-pay

## 2020-11-07 NOTE — Telephone Encounter (Signed)
Lmom to rs missed ov on 11-07-20.

## 2020-12-14 ENCOUNTER — Ambulatory Visit (INDEPENDENT_AMBULATORY_CARE_PROVIDER_SITE_OTHER): Payer: Medicaid Other | Admitting: Hospice and Palliative Medicine

## 2020-12-14 ENCOUNTER — Encounter: Payer: Self-pay | Admitting: Hospice and Palliative Medicine

## 2020-12-14 VITALS — BP 136/76 | HR 85 | Temp 97.8°F | Resp 16 | Ht 72.0 in | Wt 258.6 lb

## 2020-12-14 DIAGNOSIS — I151 Hypertension secondary to other renal disorders: Secondary | ICD-10-CM

## 2020-12-14 DIAGNOSIS — Z9359 Other cystostomy status: Secondary | ICD-10-CM

## 2020-12-14 DIAGNOSIS — N2889 Other specified disorders of kidney and ureter: Secondary | ICD-10-CM

## 2020-12-14 MED ORDER — LISINOPRIL 5 MG PO TABS: ORAL_TABLET | ORAL | 1 refills | Status: DC

## 2020-12-14 MED ORDER — METOPROLOL SUCCINATE ER 50 MG PO TB24: 50.0000 mg | ORAL_TABLET | Freq: Every day | ORAL | 1 refills | Status: DC

## 2020-12-14 MED ORDER — LISINOPRIL 10 MG PO TABS: 10.0000 mg | ORAL_TABLET | Freq: Every day | ORAL | 1 refills | Status: DC

## 2020-12-14 NOTE — Progress Notes (Signed)
[facility] [address] [place], [place] [address]  Internal MEDICINE  Office Visit Note  Patient Name: Austin Stevenson  [id number]  [id number]  Date of Service: 12/20/2020  Chief Complaint  Patient presents with  . Follow-up    Refill request  . Hypertension    HPI Patient is here for routine follow-up Failed urethral reconstruction procedure, he was hopeful to have reconstruction surgery to have suprapubic catheter removed, this procedure was unsuccessful but urologists want to proceed with another procedure, again in hopes to reverse suprapubic catheter  Frustrated by this and upset that procedure seems to have created further complications with discomfort and ED  BP has been well controlled  Current Medication: Outpatient Encounter Medications as of 12/14/2020  Medication Sig  . [DISCONTINUED] lisinopril (ZESTRIL) 10 MG tablet Take 1 tablet (10 mg total) by mouth daily.  . [DISCONTINUED] lisinopril (ZESTRIL) 5 MG tablet TAKE 1 TABLET BY MOUTH ONCE DAILY. TAKE A 5 MG AND 10 MG TABLET FOR TOTAL OF 15 MG.  . [DISCONTINUED] metoprolol succinate (TOPROL-XL) 50 MG 24 hr tablet Take 1 tablet (50 mg total) by mouth daily. Take with or immediately following a meal.  . lisinopril (ZESTRIL) 10 MG tablet Take 1 tablet (10 mg total) by mouth daily.  Marland Kitchen lisinopril (ZESTRIL) 5 MG tablet TAKE 1 TABLET BY MOUTH ONCE DAILY. TAKE A 5 MG AND 10 MG TABLET FOR TOTAL OF 15 MG.  . metoprolol succinate (TOPROL-XL) 50 MG 24 hr tablet Take 1 tablet (50 mg total) by mouth daily. Take with or immediately following a meal.   No facility-administered encounter medications on file as of 12/14/2020.    Surgical History: Past Surgical History:  Procedure Laterality Date  . ABDOMINAL SURGERY    . ABSCESS DRAINAGE  11/15/2005  . creation of cathetrizable stoma    . cystopic catheter placement   11/0/2010  . CYSTOURETHROSCOPY    . dilation of stricture  09/12/2008  . EPISPADIUS CORRECTION     . ESOPHAGOGASTRODUODENOSCOPY (EGD) WITH PROPOFOL N/A 11/25/2016   Procedure: ESOPHAGOGASTRODUODENOSCOPY (EGD) WITH PROPOFOL;  Surgeon: Midge Minium, MD;  Location: [facility] ENDOSCOPY;  Service: Endoscopy;  Laterality: N/A;  . MITROFANOFF PROCEDURE  09/10/2005  . SUPRAPUBIC CATHETER PLACEMENT  09/28/2005  . TRANSRECTAL DRAINAGE OF PELVIC ABSCESS    . URETHRAL DILATION      Medical History: Past Medical History:  Diagnosis Date  . Atrophic kidney   . Chronic kidney disease    stage 1  . Hypertension   . Obesity   . 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 Urethral fistula     Family History: Family History  Problem Relation Age of Onset  . Diabetes Mother   . Hypertension Paternal Grandfather     Social History   Socioeconomic History  . Marital status: Single    Spouse name: Not on file  . Number of children: Not on file  . Years of education: Not on file  . Highest education level: Not on file  Occupational History  . Not on file  Tobacco Use  . Smoking status: Former Smoker    Types: Cigars  . Smokeless tobacco: Never Used  Vaping Use  . Vaping Use: Every day  . Devices: e cigarettes   Substance and Sexual Activity  . Alcohol use: No  . Drug use: No  . Sexual activity: Yes  Other Topics Concern  . Not on file  Social History Narrative  . Not on file   Social  Determinants of Health   Financial Resource Strain: Not on file  Food Insecurity: Not on file  Transportation Needs: Not on file  Physical Activity: Not on file  Stress: Not on file  Social Connections: Not on file  Intimate Partner Violence: Not on file      Review of Systems  Constitutional: Negative for chills, fatigue and unexpected weight change.  HENT: Negative for congestion, postnasal drip, rhinorrhea, sneezing and sore throat.   Eyes: Negative for redness.  Respiratory: Negative for cough, chest tightness and shortness of breath.   Cardiovascular: Negative for chest pain and  palpitations.  Gastrointestinal: Negative for abdominal pain, constipation, diarrhea, nausea and vomiting.  Genitourinary: Negative for dysuria and frequency.       Suprapubic catheter  Musculoskeletal: Negative for arthralgias, back pain, joint swelling and neck pain.  Skin: Negative for rash.  Neurological: Negative for tremors and numbness.  Hematological: Negative for adenopathy. Does not bruise/bleed easily.  Psychiatric/Behavioral: Negative for behavioral problems (Depression), sleep disturbance and suicidal ideas. The patient is not nervous/anxious.     Vital Signs: BP 136/76   Pulse 85   Temp 97.8 F (36.6 C)   Resp 16   Ht 6' (1.829 m)   Wt 258 lb 9.6 oz (117.3 kg)   SpO2 98%   BMI 35.07 kg/m    Physical Exam Vitals reviewed.  Constitutional:      Appearance: Normal appearance. He is obese.  Cardiovascular:     Rate and Rhythm: Normal rate and regular rhythm.     Pulses: Normal pulses.     Heart sounds: Normal heart sounds.  Pulmonary:     Effort: Pulmonary effort is normal.     Breath sounds: Normal breath sounds.  Musculoskeletal:        General: Normal range of motion.  Skin:    General: Skin is warm.  Neurological:     General: No focal deficit present.     Mental Status: He is alert and oriented to person, place, and time. Mental status is at baseline.  Psychiatric:        Mood and Affect: Mood normal.        Behavior: Behavior normal.        Thought Content: Thought content normal.        Judgment: Judgment normal.    Assessment/Plan: 1. Hypertension secondary to other renal disorders BP and HR remain well controlled today, requesting refills, continue to monitor closely - lisinopril (ZESTRIL) 10 MG tablet; Take 1 tablet (10 mg total) by mouth daily.  Dispense: 90 tablet; Refill: 1 - lisinopril (ZESTRIL) 5 MG tablet; TAKE 1 TABLET BY MOUTH ONCE DAILY. TAKE A 5 MG AND 10 MG TABLET FOR TOTAL OF 15 MG.  Dispense: 90 tablet; Refill: 1 - metoprolol  succinate (TOPROL-XL) 50 MG 24 hr tablet; Take 1 tablet (50 mg total) by mouth daily. Take with or immediately following a meal.  Dispense: 90 tablet; Refill: 1  2. Suprapubic catheter [facility]) Followed closely by [facility] nephrology and urology Plan to proceed with second procedure in hopes to reverse catheter  General Counseling: Jeanell Sparrow understanding of the findings of todays visit and agrees with plan of treatment. I have discussed any further diagnostic evaluation that may be needed or ordered today. We also reviewed his medications today. he has been encouraged to call the office with any questions or concerns that should arise related to todays visit.   Meds ordered this encounter  Medications  . lisinopril (ZESTRIL)  10 MG tablet    Sig: Take 1 tablet (10 mg total) by mouth daily.    Dispense:  90 tablet    Refill:  1    PLEASE provide 90 day supply. Patient should take 15 mg total, one 5 mg and one 10 mg daily.  Marland Kitchen lisinopril (ZESTRIL) 5 MG tablet    Sig: TAKE 1 TABLET BY MOUTH ONCE DAILY. TAKE A 5 MG AND 10 MG TABLET FOR TOTAL OF 15 MG.    Dispense:  90 tablet    Refill:  1    PLEASE provide 90 day supply.  . metoprolol succinate (TOPROL-XL) 50 MG 24 hr tablet    Sig: Take 1 tablet (50 mg total) by mouth daily. Take with or immediately following a meal.    Dispense:  90 tablet    Refill:  1    Time spent: 30 Minutes Time spent includes review of chart, medications, test results and follow-up plan with the patient.  This patient was seen by Leeanne Deed AGNP-C in Collaboration with Dr Lyndon Code as a part of collaborative care agreement     Lubertha Basque. Markeya Mincy AGNP-C Internal medicine

## 2020-12-18 ENCOUNTER — Encounter: Payer: Self-pay | Admitting: Nurse Practitioner

## 2020-12-18 ENCOUNTER — Other Ambulatory Visit: Payer: Self-pay

## 2020-12-18 ENCOUNTER — Ambulatory Visit (INDEPENDENT_AMBULATORY_CARE_PROVIDER_SITE_OTHER): Payer: Medicaid Other | Admitting: Nurse Practitioner

## 2020-12-18 VITALS — BP 108/60 | HR 105 | Temp 97.8°F | Resp 16 | Ht 72.0 in | Wt 256.2 lb

## 2020-12-18 DIAGNOSIS — R319 Hematuria, unspecified: Secondary | ICD-10-CM

## 2020-12-18 DIAGNOSIS — R300 Dysuria: Secondary | ICD-10-CM

## 2020-12-18 DIAGNOSIS — N390 Urinary tract infection, site not specified: Secondary | ICD-10-CM

## 2020-12-18 LAB — POCT URINALYSIS DIPSTICK
Bilirubin, UA: NEGATIVE
Glucose, UA: NEGATIVE
Nitrite, UA: NEGATIVE
Protein, UA: POSITIVE — AB
Spec Grav, UA: 1.01 (ref 1.010–1.025)
Urobilinogen, UA: 0.2 E.U./dL
pH, UA: 6 (ref 5.0–8.0)

## 2020-12-18 MED ORDER — SULFAMETHOXAZOLE-TRIMETHOPRIM 800-160 MG PO TABS: 1.0000 | ORAL_TABLET | Freq: Two times a day (BID) | ORAL | 0 refills | Status: DC

## 2020-12-18 NOTE — Progress Notes (Signed)
[facility] [address] [place], [place] [address]  Internal MEDICINE  Office Visit Note  Patient Name: Austin Stevenson  [id number]  [id number]  Date of Service: 01/05/2021   Pt is here for a sick visit.  Chief Complaint  Patient presents with  . Acute Visit    Kidney infection  . Hypertension     The patient is here for acute visit. Has been having bilateral flank pain and pressure in his bladder for past several days. Denies burning or pain while urinating. He denies nausea, vomiting, or diarrhea. He denies fever. He does have history of frequent urinary tract infections.       Current Medication:  Outpatient Encounter Medications as of 12/18/2020  Medication Sig  . lisinopril (ZESTRIL) 10 MG tablet Take 1 tablet (10 mg total) by mouth daily.  Marland Kitchen lisinopril (ZESTRIL) 5 MG tablet TAKE 1 TABLET BY MOUTH ONCE DAILY. TAKE A 5 MG AND 10 MG TABLET FOR TOTAL OF 15 MG.  . metoprolol succinate (TOPROL-XL) 50 MG 24 hr tablet Take 1 tablet (50 mg total) by mouth daily. Take with or immediately following a meal.  . sulfamethoxazole-trimethoprim (BACTRIM DS) 800-160 MG tablet Take 1 tablet by mouth 2 (two) times daily.   No facility-administered encounter medications on file as of 12/18/2020.      Medical History: Past Medical History:  Diagnosis Date  . Atrophic kidney   . Chronic kidney disease    stage 1  . Hypertension   . Obesity   . 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 Urethral fistula      Today's Vitals   12/18/20 1107  BP: 108/60  Pulse: (!) 105  Resp: 16  Temp: 97.8 F (36.6 C)  SpO2: 98%  Weight: 256 lb 3.2 oz (116.2 kg)  Height: 6' (1.829 m)   Body mass index is 34.75 kg/m.  Review of Systems  Constitutional: Negative for activity change, chills, fatigue and unexpected weight change.  HENT: Negative for congestion, postnasal drip, rhinorrhea, sneezing and sore throat.   Respiratory: Negative for cough, chest tightness and  shortness of breath.   Cardiovascular: Negative for chest pain and palpitations.  Gastrointestinal: Negative for abdominal pain, constipation, diarrhea, nausea and vomiting.  Genitourinary: Positive for flank pain, frequency and urgency. Negative for dysuria.  Musculoskeletal: Positive for back pain. Negative for arthralgias, joint swelling and neck pain.  Skin: Negative for rash.  Neurological: Negative for dizziness, tremors, numbness and headaches.  Hematological: Negative for adenopathy. Does not bruise/bleed easily.  Psychiatric/Behavioral: Negative for behavioral problems (Depression), sleep disturbance and suicidal ideas. The patient is not nervous/anxious.     Physical Exam Vitals and nursing note reviewed.  Constitutional:      General: He is not in acute distress.    Appearance: Normal appearance. He is well-developed and well-nourished. He is not diaphoretic.  HENT:     Head: Normocephalic and atraumatic.     Mouth/Throat:     Mouth: Oropharynx is clear and moist.     Pharynx: No oropharyngeal exudate.  Eyes:     Extraocular Movements: EOM normal.     Pupils: Pupils are equal, round, and reactive to light.  Neck:     Thyroid: No thyromegaly.     Vascular: No JVD.     Trachea: No tracheal deviation.  Cardiovascular:     Rate and Rhythm: Normal rate and regular rhythm.     Heart sounds: Normal heart sounds. No murmur heard. No friction rub. No  gallop.   Pulmonary:     Effort: Pulmonary effort is normal. No respiratory distress.     Breath sounds: Normal breath sounds. No wheezing or rales.  Chest:     Chest wall: No tenderness.  Abdominal:     General: Bowel sounds are normal.     Palpations: Abdomen is soft.     Tenderness: There is no abdominal tenderness.  Genitourinary:    Comments: Urine sample is positive for protein, large blood, and large WBC.  Musculoskeletal:        General: Normal range of motion.     Cervical back: Normal range of motion and neck  supple.  Lymphadenopathy:     Cervical: No cervical adenopathy.  Skin:    General: Skin is warm and dry.  Neurological:     Mental Status: He is alert and oriented to person, place, and time.     Cranial Nerves: No cranial nerve deficit.  Psychiatric:        Mood and Affect: Mood and affect and mood normal.        Behavior: Behavior normal.        Thought Content: Thought content normal.        Judgment: Judgment normal.    Assessment/Plan:  1. Urinary tract infection with hematuria, site unspecified Start bactrim DS twice daily for next 10 days. Will send urine sample for culture and sensitivity and adjust antibiotic treatment as indicated.  - sulfamethoxazole-trimethoprim (BACTRIM DS) 800-160 MG tablet; Take 1 tablet by mouth 2 (two) times daily.  Dispense: 20 tablet; Refill: 0  2. Dysuria Start bactrim DS twice daily for next 10 days. Will send urine sample for culture and sensitivity and adjust antibiotic treatment as indicated. - POCT Urinalysis Dipstick - CULTURE, URINE COMPREHENSIVE General Counseling: Dene verbalizes understanding of the findings of todays visit and agrees with plan of treatment. I have discussed any further diagnostic evaluation that may be needed or ordered today. We also reviewed his medications today. he has been encouraged to call the office with any questions or concerns that should arise related to todays visit.    Counseling:  This patient was seen by Vincent Gros FNP Collaboration with Dr Lyndon Code as a part of collaborative care agreement  Orders Placed This Encounter  Procedures  . CULTURE, URINE COMPREHENSIVE  . POCT Urinalysis Dipstick    Meds ordered this encounter  Medications  . sulfamethoxazole-trimethoprim (BACTRIM DS) 800-160 MG tablet    Sig: Take 1 tablet by mouth 2 (two) times daily.    Dispense:  20 tablet    Refill:  0    Order Specific Question:   Supervising Provider    Answer:   Lyndon Code [[id number]]    Time  spent: 25 Minutes

## 2020-12-20 ENCOUNTER — Encounter: Payer: Self-pay | Admitting: Hospice and Palliative Medicine

## 2020-12-20 NOTE — Progress Notes (Signed)
Patient started on bactrim Ds at his visit

## 2020-12-21 ENCOUNTER — Telehealth: Payer: Self-pay

## 2020-12-21 NOTE — Telephone Encounter (Signed)
-----   Message from Carlean Jews, NP sent at 12/20/2020  2:54 PM EST ----- Patient started on bactrim Ds at his visit

## 2020-12-21 NOTE — Telephone Encounter (Signed)
Gash, no. Still waiting on full culture and sensitivity results. That must have automatically sent to you. You don't need to tell him anything right now. Thanks.

## 2020-12-22 LAB — CULTURE, URINE COMPREHENSIVE

## 2021-05-28 ENCOUNTER — Ambulatory Visit: Payer: Medicaid Other | Admitting: Physician Assistant

## 2021-05-28 ENCOUNTER — Encounter: Payer: Self-pay | Admitting: Physician Assistant

## 2021-05-28 ENCOUNTER — Other Ambulatory Visit: Payer: Self-pay

## 2021-05-28 VITALS — BP 130/72 | HR 97 | Temp 98.7°F | Resp 16 | Ht 72.0 in | Wt 287.4 lb

## 2021-05-28 DIAGNOSIS — N390 Urinary tract infection, site not specified: Secondary | ICD-10-CM | POA: Diagnosis not present

## 2021-05-28 DIAGNOSIS — R300 Dysuria: Secondary | ICD-10-CM

## 2021-05-28 DIAGNOSIS — R319 Hematuria, unspecified: Secondary | ICD-10-CM | POA: Diagnosis not present

## 2021-05-28 LAB — POCT URINALYSIS DIPSTICK
Bilirubin, UA: NEGATIVE
Glucose, UA: NEGATIVE
Ketones, UA: NEGATIVE
Nitrite, UA: POSITIVE
Protein, UA: POSITIVE — AB
Spec Grav, UA: 1.015 (ref 1.010–1.025)
Urobilinogen, UA: 0.2 E.U./dL
pH, UA: 7 (ref 5.0–8.0)

## 2021-05-28 MED ORDER — SULFAMETHOXAZOLE-TRIMETHOPRIM 800-160 MG PO TABS
1.0000 | ORAL_TABLET | Freq: Two times a day (BID) | ORAL | 0 refills | Status: DC
Start: 2021-05-28 — End: 2022-01-10

## 2021-05-28 NOTE — Progress Notes (Signed)
[facility] [address] [place], [place] [address]  Internal MEDICINE  Office Visit Note  Patient Name: Austin Stevenson  [id number]  [id number]  Date of Service: 05/30/2021  Chief Complaint  Patient presents with   Acute Visit    UTI      HPI Pt is here for a sick visit. -Burning and discomfort with urination since last Wednesday, started to see some blood in urine on Thursday. Smell to urine. A little bit of lower back pain. Drinking a lot of water over the weekend and found that urine had cleared up and less symptoms. -has had a UTI before with similar symptoms -He is followed by [facility] urology, now seeing them q6 months he thinks. They have him on oxybutynin. Hx of frequent UTI. -started new job as a Curator  Current Medication:  Outpatient Encounter Medications as of 05/28/2021  Medication Sig   lisinopril (ZESTRIL) 10 MG tablet Take 1 tablet (10 mg total) by mouth daily.   lisinopril (ZESTRIL) 5 MG tablet TAKE 1 TABLET BY MOUTH ONCE DAILY. TAKE A 5 MG AND 10 MG TABLET FOR TOTAL OF 15 MG.   metoprolol succinate (TOPROL-XL) 50 MG 24 hr tablet Take 1 tablet (50 mg total) by mouth daily. Take with or immediately following a meal.   [DISCONTINUED] sulfamethoxazole-trimethoprim (BACTRIM DS) 800-160 MG tablet Take 1 tablet by mouth 2 (two) times daily.   sulfamethoxazole-trimethoprim (BACTRIM DS) 800-160 MG tablet Take 1 tablet by mouth 2 (two) times daily.   No facility-administered encounter medications on file as of 05/28/2021.      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  HENT:  Negative for congestion, mouth sores and postnasal drip.   Respiratory:  Negative for cough.   Cardiovascular:  Negative for chest pain.  Gastrointestinal:  Negative for abdominal pain.  Genitourinary:  Positive for dysuria, flank pain and hematuria.  Psychiatric/Behavioral: Negative.     Physical Exam Vitals and nursing note reviewed.  Constitutional:      General: He is not in acute distress.    Appearance: He is well-developed. He is obese. He is not diaphoretic.  HENT:     Head: Normocephalic and atraumatic.     Mouth/Throat:     Pharynx: No oropharyngeal exudate.  Eyes:     Pupils: Pupils are equal, round, and reactive to light.  Neck:     Thyroid: No thyromegaly.     Vascular: No JVD.     Trachea: No tracheal deviation.  Cardiovascular:     Rate and Rhythm: Normal rate and regular rhythm.     Heart sounds: Normal heart sounds. No murmur heard.   No friction rub. No gallop.  Pulmonary:     Effort: Pulmonary effort is normal. No respiratory distress.     Breath sounds: No wheezing or rales.  Chest:     Chest wall: No tenderness.  Abdominal:     General:  Bowel sounds are normal.     Palpations: Abdomen is soft.     Tenderness: There is no abdominal tenderness. There is no right CVA tenderness or left CVA tenderness.  Musculoskeletal:        General: Normal range of motion.     Cervical back: Normal range of motion and neck supple.  Lymphadenopathy:     Cervical: No cervical adenopathy.  Skin:    General: Skin is warm and dry.  Neurological:     Mental Status: He is alert and oriented to person, place, and time.     Cranial Nerves: No cranial nerve deficit.  Psychiatric:        Behavior: Behavior normal.        Thought Content: Thought content normal.        Judgment: Judgment normal.      Assessment/Plan: 1. Urinary tract infection with hematuria, site unspecified Based on dipstick results we will go ahead and start on treatment for UTI,  patient previously has done well with use of Bactrim.  We will send for culture and adjust therapy as indicated.  Patient also advised to follow-up with urologist - sulfamethoxazole-trimethoprim (BACTRIM DS) 800-160 MG tablet; Take 1 tablet by mouth 2 (two) times daily.  Dispense: 20 tablet; Refill: 0  2. Dysuria - POCT urinalysis dipstick: +protein, with moderate blood and leukocytes - CULTURE, URINE COMPREHENSIVE   General Counseling: Andrell verbalizes understanding of the findings of todays visit and agrees with plan of treatment. I have discussed any further diagnostic evaluation that may be needed or ordered today. We also reviewed his medications today. he has been encouraged to call the office with any questions or concerns that should arise related to todays visit.    Counseling:    Orders Placed This Encounter  Procedures   CULTURE, URINE COMPREHENSIVE   POCT urinalysis dipstick    Meds ordered this encounter  Medications   sulfamethoxazole-trimethoprim (BACTRIM DS) 800-160 MG tablet    Sig: Take 1 tablet by mouth 2 (two) times daily.    Dispense:  20 tablet    Refill:  0    Time spent:30 Minutes

## 2021-06-05 NOTE — Progress Notes (Signed)
Pt was not available to speak to but he will be in on Thursday for his appt

## 2021-06-07 ENCOUNTER — Other Ambulatory Visit: Payer: Self-pay

## 2021-06-07 ENCOUNTER — Ambulatory Visit: Payer: Medicaid Other | Admitting: Physician Assistant

## 2021-06-07 ENCOUNTER — Encounter: Payer: Self-pay | Admitting: Physician Assistant

## 2021-06-07 DIAGNOSIS — N3001 Acute cystitis with hematuria: Secondary | ICD-10-CM

## 2021-06-07 DIAGNOSIS — R300 Dysuria: Secondary | ICD-10-CM | POA: Diagnosis not present

## 2021-06-07 DIAGNOSIS — I151 Hypertension secondary to other renal disorders: Secondary | ICD-10-CM

## 2021-06-07 DIAGNOSIS — N2889 Other specified disorders of kidney and ureter: Secondary | ICD-10-CM | POA: Diagnosis not present

## 2021-06-07 DIAGNOSIS — N261 Atrophy of kidney (terminal): Secondary | ICD-10-CM

## 2021-06-07 DIAGNOSIS — Z0189 Encounter for other specified special examinations: Secondary | ICD-10-CM

## 2021-06-07 LAB — POCT URINALYSIS DIPSTICK
Bilirubin, UA: NEGATIVE
Glucose, UA: NEGATIVE
Nitrite, UA: POSITIVE
Protein, UA: POSITIVE — AB
Spec Grav, UA: 1.01 (ref 1.010–1.025)
Urobilinogen, UA: 0.2 E.U./dL
pH, UA: 6 (ref 5.0–8.0)

## 2021-06-07 LAB — CULTURE, URINE COMPREHENSIVE

## 2021-06-07 MED ORDER — CIPROFLOXACIN HCL 500 MG PO TABS: 500.0000 mg | ORAL_TABLET | Freq: Two times a day (BID) | ORAL | 0 refills | Status: DC

## 2021-06-07 NOTE — Progress Notes (Signed)
[facility] [address] [place], [place] [address]  Internal MEDICINE  Office Visit Note  Patient Name: Austin Stevenson  [id number]  [id number]  Date of Service: 06/10/2021  Chief Complaint  Patient presents with   Follow-up    Discuss kidney infection from a few weeks ago    Hypertension   Quality Metric Gaps    Covid booster, pneumovax    HPI Pt is here for routine follow up -Stopped the additional 5mg  lisinopril and is just taking the 10mg  lisinopril and his metoprolol since his BP was dropping too low -HR has been high and not urinating very much despite increasing water intake with new job. Will go ahead and switch ABX to cipro since this is sensitive on C/S from last week and dip today still shows infection. Denies any dysuria or low back pain anymore though. Denies palpitations or fluttering. Will continue to monitor once infection is gone and pt has adjusted to demand of new job. He will also try to be more aware and monitor for symptoms himself and will call if these arise -Will also update labs. -Sees urology, used to see nephrology, but not recently due to schedule. He states he has hx of CKD3. Urology repeated renal showing stable right renal atrophy. Advised pt to re-establish with nephrology and try to coordinate appts with urology visits since he reports they are located near each other  Current Medication: Outpatient Encounter Medications as of 06/07/2021  Medication Sig   lisinopril (ZESTRIL) 10 MG tablet Take 1 tablet (10 mg total) by mouth daily.   lisinopril (ZESTRIL) 5 MG tablet TAKE 1 TABLET BY MOUTH ONCE DAILY. TAKE A 5 MG AND 10 MG TABLET FOR TOTAL OF 15 MG.   metoprolol succinate (TOPROL-XL) 50 MG 24 hr tablet Take 1 tablet (50 mg total) by mouth daily. Take with or immediately following a meal.   sulfamethoxazole-trimethoprim (BACTRIM DS) 800-160 MG tablet Take 1 tablet by mouth 2 (two) times daily.   [DISCONTINUED] ciprofloxacin (CIPRO) 500 MG  tablet Take 1 tablet (500 mg total) by mouth 2 (two) times daily for 10 days.   No facility-administered encounter medications on file as of 06/07/2021.    Surgical History: Past Surgical History:  Procedure Laterality Date   ABDOMINAL SURGERY     ABSCESS DRAINAGE  11/15/2005   creation of cathetrizable stoma     cystopic catheter placement   11/0/2010   CYSTOURETHROSCOPY     dilation of stricture  09/12/2008   EPISPADIUS CORRECTION     ESOPHAGOGASTRODUODENOSCOPY (EGD) WITH PROPOFOL N/A 11/25/2016   Procedure: ESOPHAGOGASTRODUODENOSCOPY (EGD) WITH PROPOFOL;  Surgeon: 09/14/2008, MD;  Location: [facility] ENDOSCOPY;  Service: Endoscopy;  Laterality: N/A;   MITROFANOFF PROCEDURE  09/10/2005   SUPRAPUBIC CATHETER PLACEMENT  09/28/2005   TRANSRECTAL DRAINAGE OF PELVIC ABSCESS     URETHRAL DILATION      Medical History: 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    Urethral fistula     Family History: Family History  Problem Relation Age of Onset   Diabetes Mother    Hypertension Paternal Grandfather     Social History   Socioeconomic History   Marital status: Single    Spouse name: Not on file   Number of children: Not on file   Years of education: Not on  file   Highest education level: Not on file  Occupational History   Not on file  Tobacco Use   Smoking status: Former    Pack years: 0.00    Types: Cigars   Smokeless tobacco: Never  Vaping Use   Vaping Use: Every day   Devices: e cigarettes   Substance and Sexual Activity   Alcohol use: No   Drug use: No   Sexual activity: Yes  Other Topics Concern   Not on file  Social History Narrative   Not on file   Social Determinants of Health   Financial Resource Strain: Not on file  Food Insecurity: Not on file  Transportation Needs: Not on file  Physical Activity: Not on file  Stress: Not on  file  Social Connections: Not on file  Intimate Partner Violence: Not on file      Review of Systems  Constitutional:  Positive for fatigue. Negative for chills and unexpected weight change.  HENT:  Negative for congestion, postnasal drip, rhinorrhea, sneezing and sore throat.   Eyes:  Negative for redness.  Respiratory:  Negative for cough, chest tightness and shortness of breath.   Cardiovascular:  Negative for chest pain and palpitations.  Gastrointestinal:  Negative for abdominal pain, constipation, diarrhea, nausea and vomiting.  Genitourinary:  Negative for dysuria and frequency.  Musculoskeletal:  Negative for arthralgias, back pain, joint swelling and neck pain.  Skin:  Negative for rash.  Neurological: Negative.  Negative for tremors and numbness.  Hematological:  Negative for adenopathy. Does not bruise/bleed easily.  Psychiatric/Behavioral:  Negative for behavioral problems (Depression), sleep disturbance and suicidal ideas. The patient is not nervous/anxious.    Vital Signs: BP 114/76   Pulse 100   Temp 97.8 F (36.6 C)   Resp 16   Ht 6' (1.829 m)   Wt 277 lb 3.2 oz (125.7 kg)   SpO2 98%   BMI 37.60 kg/m    Physical Exam Vitals and nursing note reviewed.  Constitutional:      General: He is not in acute distress.    Appearance: He is well-developed. He is obese. He is not diaphoretic.  HENT:     Head: Normocephalic and atraumatic.     Mouth/Throat:     Pharynx: No oropharyngeal exudate.  Eyes:     Pupils: Pupils are equal, round, and reactive to light.  Neck:     Thyroid: No thyromegaly.     Vascular: No JVD.     Trachea: No tracheal deviation.  Cardiovascular:     Rate and Rhythm: Normal rate and regular rhythm.     Heart sounds: Normal heart sounds. No murmur heard.   No friction rub. No gallop.  Pulmonary:     Effort: Pulmonary effort is normal. No respiratory distress.     Breath sounds: No wheezing or rales.  Chest:     Chest wall: No  tenderness.  Abdominal:     General: Bowel sounds are normal.     Palpations: Abdomen is soft.     Tenderness: There is no right CVA tenderness or left CVA tenderness.  Musculoskeletal:        General: Normal range of motion.     Cervical back: Normal range of motion and neck supple.  Lymphadenopathy:     Cervical: No cervical adenopathy.  Skin:    General: Skin is warm and dry.  Neurological:     Mental Status: He is alert and oriented to person, place, and time.  Cranial Nerves: No cranial nerve deficit.  Psychiatric:        Behavior: Behavior normal.        Thought Content: Thought content normal.        Judgment: Judgment normal.       Assessment/Plan: 1. Hypertension secondary to other renal disorders Stable, continue lisinopril and metoprolol. Will monitor HR and call office if continues to be high or has symptoms of palpitations or fluttering  2. Acute cystitis with hematuria Will switch to cipro since this was sensitive on C/S last week. Continue to stay well hydrated and f/u with urology  3. Atrophic kidney, acquired Stable per urology, advised pt re-establish with nephrology and will update labs to monitor renal function  4. Encounter for laboratory test - CBC w/Diff/Platelet - Comprehensive metabolic panel - Lipid Panel With LDL/HDL Ratio - TSH + free T4  5. Dysuria - POCT Urinalysis Dipstick - CULTURE, URINE COMPREHENSIVE   General Counseling: Terral verbalizes understanding of the findings of todays visit and agrees with plan of treatment. I have discussed any further diagnostic evaluation that may be needed or ordered today. We also reviewed his medications today. he has been encouraged to call the office with any questions or concerns that should arise related to todays visit.    Orders Placed This Encounter  Procedures   CULTURE, URINE COMPREHENSIVE   CBC w/Diff/Platelet   Comprehensive metabolic panel   Lipid Panel With LDL/HDL Ratio   TSH +  free T4   POCT Urinalysis Dipstick     Meds ordered this encounter  Medications   DISCONTD: ciprofloxacin (CIPRO) 500 MG tablet    Sig: Take 1 tablet (500 mg total) by mouth 2 (two) times daily for 10 days.    Dispense:  20 tablet    Refill:  0     This patient was seen by Lynn Ito, PA-C in collaboration with Dr. Beverely Risen as a part of collaborative care agreement.   Total time spent:35 Minutes Time spent includes review of chart, medications, test results, and follow up plan with the patient.      Dr Lyndon Code Internal medicine

## 2021-06-08 ENCOUNTER — Other Ambulatory Visit: Payer: Self-pay

## 2021-06-08 DIAGNOSIS — R300 Dysuria: Secondary | ICD-10-CM

## 2021-06-08 MED ORDER — CIPROFLOXACIN HCL 500 MG PO TABS: 500.0000 mg | ORAL_TABLET | Freq: Two times a day (BID) | ORAL | 0 refills | Status: AC

## 2021-06-12 LAB — CULTURE, URINE COMPREHENSIVE

## 2021-06-14 LAB — CBC WITH DIFFERENTIAL/PLATELET
Basophils Absolute: 0 10*3/uL (ref 0.0–0.2)
Basos: 0 %
EOS (ABSOLUTE): 0.3 10*3/uL (ref 0.0–0.4)
Eos: 5 %
Hematocrit: 47.1 % (ref 37.5–51.0)
Hemoglobin: 16 g/dL (ref 13.0–17.7)
Immature Grans (Abs): 0 10*3/uL (ref 0.0–0.1)
Immature Granulocytes: 1 %
Lymphocytes Absolute: 1.8 10*3/uL (ref 0.7–3.1)
Lymphs: 35 %
MCH: 27.8 pg (ref 26.6–33.0)
MCHC: 34 g/dL (ref 31.5–35.7)
MCV: 82 fL (ref 79–97)
Monocytes Absolute: 0.5 10*3/uL (ref 0.1–0.9)
Monocytes: 9 %
Neutrophils Absolute: 2.6 10*3/uL (ref 1.4–7.0)
Neutrophils: 50 %
Platelets: 236 10*3/uL (ref 150–450)
RBC: 5.75 x10E6/uL (ref 4.14–5.80)
RDW: 13.1 % (ref 11.6–15.4)
WBC: 5.1 10*3/uL (ref 3.4–10.8)

## 2021-06-14 LAB — COMPREHENSIVE METABOLIC PANEL
ALT: 28 IU/L (ref 0–44)
AST: 28 IU/L (ref 0–40)
Albumin/Globulin Ratio: 1.3 (ref 1.2–2.2)
Albumin: 4.5 g/dL (ref 4.1–5.2)
Alkaline Phosphatase: 75 IU/L (ref 44–121)
BUN/Creatinine Ratio: 15 (ref 9–20)
BUN: 22 mg/dL — ABNORMAL HIGH (ref 6–20)
Bilirubin Total: 0.4 mg/dL (ref 0.0–1.2)
CO2: 23 mmol/L (ref 20–29)
Calcium: 9.6 mg/dL (ref 8.7–10.2)
Chloride: 103 mmol/L (ref 96–106)
Creatinine, Ser: 1.5 mg/dL — ABNORMAL HIGH (ref 0.76–1.27)
Globulin, Total: 3.4 g/dL (ref 1.5–4.5)
Glucose: 98 mg/dL (ref 65–99)
Potassium: 5.1 mmol/L (ref 3.5–5.2)
Sodium: 140 mmol/L (ref 134–144)
Total Protein: 7.9 g/dL (ref 6.0–8.5)
eGFR: 67 mL/min/{1.73_m2} (ref >59)

## 2021-06-14 LAB — LIPID PANEL WITH LDL/HDL RATIO
Cholesterol, Total: 134 mg/dL (ref 100–199)
HDL: 50 mg/dL (ref >39)
LDL Chol Calc (NIH): 71 mg/dL (ref 0–99)
LDL/HDL Ratio: 1.4 ratio (ref 0.0–3.6)
Triglycerides: 65 mg/dL (ref 0–149)
VLDL Cholesterol Cal: 13 mg/dL (ref 5–40)

## 2021-06-14 LAB — TSH+FREE T4
Free T4: 1.39 ng/dL (ref 0.82–1.77)
TSH: 2.69 u[IU]/mL (ref 0.450–4.500)

## 2021-08-03 ENCOUNTER — Other Ambulatory Visit: Payer: Self-pay

## 2021-08-03 DIAGNOSIS — I151 Hypertension secondary to other renal disorders: Secondary | ICD-10-CM

## 2021-08-03 DIAGNOSIS — N2889 Other specified disorders of kidney and ureter: Secondary | ICD-10-CM

## 2021-08-03 MED ORDER — METOPROLOL SUCCINATE ER 50 MG PO TB24: 50.0000 mg | ORAL_TABLET | Freq: Every day | ORAL | 1 refills | Status: DC

## 2021-08-21 ENCOUNTER — Encounter: Payer: Medicaid Other | Admitting: Nurse Practitioner

## 2021-09-26 ENCOUNTER — Telehealth: Payer: Self-pay

## 2021-09-26 NOTE — Telephone Encounter (Signed)
Left vm for patient to return my call to cancel/reschedule appointment per his request-Toni

## 2021-10-01 ENCOUNTER — Encounter: Payer: Medicaid Other | Admitting: Physician Assistant

## 2021-12-24 ENCOUNTER — Ambulatory Visit: Payer: Medicaid Other | Admitting: Physician Assistant

## 2021-12-24 ENCOUNTER — Other Ambulatory Visit: Payer: Self-pay

## 2021-12-24 ENCOUNTER — Encounter: Payer: Self-pay | Admitting: Physician Assistant

## 2021-12-24 ENCOUNTER — Telehealth: Payer: Self-pay

## 2021-12-24 DIAGNOSIS — Z0001 Encounter for general adult medical examination with abnormal findings: Secondary | ICD-10-CM

## 2021-12-24 DIAGNOSIS — N35014 Post-traumatic urethral stricture, male, unspecified: Secondary | ICD-10-CM

## 2021-12-24 DIAGNOSIS — N1831 Chronic kidney disease, stage 3a: Secondary | ICD-10-CM

## 2021-12-24 DIAGNOSIS — R300 Dysuria: Secondary | ICD-10-CM

## 2021-12-24 DIAGNOSIS — E669 Obesity, unspecified: Secondary | ICD-10-CM

## 2021-12-24 DIAGNOSIS — N261 Atrophy of kidney (terminal): Secondary | ICD-10-CM

## 2021-12-24 DIAGNOSIS — I151 Hypertension secondary to other renal disorders: Secondary | ICD-10-CM

## 2021-12-24 DIAGNOSIS — N2889 Other specified disorders of kidney and ureter: Secondary | ICD-10-CM

## 2021-12-24 DIAGNOSIS — Z23 Encounter for immunization: Secondary | ICD-10-CM

## 2021-12-24 MED ORDER — LISINOPRIL 10 MG PO TABS: 10.0000 mg | ORAL_TABLET | Freq: Every day | ORAL | 1 refills | Status: DC

## 2021-12-24 MED ORDER — TETANUS-DIPHTH-ACELL PERTUSSIS 5-2.5-18.5 LF-MCG/0.5 IM SUSY: 0.5000 mL | PREFILLED_SYRINGE | Freq: Once | INTRAMUSCULAR | 0 refills | Status: AC

## 2021-12-24 NOTE — Telephone Encounter (Signed)
Awaiting 12/24/21 office notes for nephrology referral-Austin Stevenson

## 2021-12-24 NOTE — Progress Notes (Signed)
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 Just took metoprolol right before coming into office which may explain this. Normally feels it is well controlled. -BP well controlled -He is followed by urology and per pt and their note they advised he establish with new nephrologist as well since he would like to stay more local-- a new referral for this will be placed today -Routine fasting labs reviewed last visit  Current Medication: Outpatient Encounter Medications as of 12/24/2021  Medication Sig   lisinopril (ZESTRIL) 5 MG tablet TAKE 1 TABLET BY MOUTH ONCE DAILY. TAKE A 5 MG AND 10 MG TABLET FOR TOTAL OF 15 MG.   metoprolol succinate (TOPROL-XL) 50 MG 24 hr tablet Take 1 tablet (50 mg total) by mouth daily. Take with or immediately following a meal.   sulfamethoxazole-trimethoprim (BACTRIM DS) 800-160 MG tablet Take 1 tablet by mouth 2 (two) times daily.   [DISCONTINUED] lisinopril (ZESTRIL) 10 MG tablet Take 1 tablet (10 mg total) by mouth daily.   [DISCONTINUED] Tdap (BOOSTRIX) 5-2.5-18.5 LF-MCG/0.5 injection Inject 0.5 mLs into the muscle once.   lisinopril (ZESTRIL) 10 MG tablet Take 1 tablet (10 mg total) by mouth daily.   [EXPIRED] Tdap (BOOSTRIX) 5-2.5-18.5 LF-MCG/0.5 injection Inject 0.5 mLs into the muscle once for 1 dose.   No facility-administered  encounter medications on file as of 12/24/2021.    Surgical History: Past Surgical History:  Procedure Laterality Date   ABDOMINAL SURGERY     ABSCESS DRAINAGE  11/15/2005   creation of cathetrizable stoma     cystopic catheter placement   11/0/2010   CYSTOURETHROSCOPY     dilation of stricture  09/12/2008   EPISPADIUS CORRECTION     ESOPHAGOGASTRODUODENOSCOPY (EGD) WITH PROPOFOL N/A 11/25/2016   Procedure: ESOPHAGOGASTRODUODENOSCOPY (EGD) WITH PROPOFOL;  Surgeon: Lucilla Lame, MD;  Location: [facility] ENDOSCOPY;  Service: Endoscopy;  Laterality: N/A;   MITROFANOFF PROCEDURE  09/10/2005   SUPRAPUBIC CATHETER PLACEMENT  09/28/2005   TRANSRECTAL DRAINAGE OF PELVIC ABSCESS     URETHRAL DILATION      Medical History: 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  HENT:  Negative for congestion, postnasal drip, rhinorrhea, sneezing and sore throat.   Eyes:  Negative for redness.  Respiratory:  Negative for cough, chest tightness and shortness of breath.   Cardiovascular:  Negative for chest pain and palpitations.  Gastrointestinal:  Negative for  abdominal pain, constipation, diarrhea, nausea and vomiting.  Genitourinary:  Negative for dysuria and frequency.  Musculoskeletal:  Negative for arthralgias, back pain, joint swelling and neck pain.  Skin:  Negative for rash.  Neurological: Negative.  Negative for tremors and numbness.  Hematological:  Negative for adenopathy. Does not bruise/bleed easily.  Psychiatric/Behavioral:  Negative for behavioral problems (Depression), sleep disturbance and suicidal ideas. The patient is not  nervous/anxious.     Vital Signs: BP 123/86    Pulse (!) 113    Temp 98.2 F (36.8 C)    Resp 16    Ht 6' (1.829 m)    Wt 285 lb 12.8 oz (129.6 kg)    SpO2 99%    BMI 38.76 kg/m    Physical Exam Vitals and nursing note reviewed.  Constitutional:      General: He is not in acute distress.    Appearance: He is well-developed. He is obese. He is not diaphoretic.  HENT:     Head: Normocephalic and atraumatic.     Mouth/Throat:     Pharynx: No oropharyngeal exudate.  Eyes:     Pupils: Pupils are equal, round, and reactive to light.  Neck:     Thyroid: No thyromegaly.     Vascular: No JVD.     Trachea: No tracheal deviation.  Cardiovascular:     Rate and Rhythm: Normal rate and regular rhythm.     Heart sounds: Normal heart sounds. No murmur heard.   No friction rub. No gallop.  Pulmonary:     Effort: Pulmonary effort is normal. No respiratory distress.     Breath sounds: No wheezing or rales.  Chest:     Chest wall: No tenderness.  Abdominal:     General: Bowel sounds are normal.     Palpations: Abdomen is soft.     Tenderness: There is no abdominal tenderness.  Musculoskeletal:        General: Normal range of motion.     Cervical back: Normal range of motion and neck supple.  Lymphadenopathy:     Cervical: No cervical adenopathy.  Skin:    General: Skin is warm and dry.  Neurological:     Mental Status: He is alert and oriented to person, place, and time.     Cranial Nerves: No cranial nerve deficit.  Psychiatric:        Behavior: Behavior normal.        Thought Content: Thought content normal.        Judgment: Judgment normal.     LABS: Recent Results (from the past 2160 hour(s))  UA/M w/rflx Culture, Routine     Status: Abnormal (Preliminary result)   Collection Time: 12/24/21  2:54 PM   Specimen: Urine   Urine  Result Value Ref Range   Specific Gravity, UA 1.020 1.005 - 1.030   pH, UA 6.5 5.0 - 7.5   Color, UA Yellow Yellow   Appearance Ur Cloudy (A)  Clear   Leukocytes,UA 2+ (A) Negative   Protein,UA 3+ (A) Negative/Trace   Glucose, UA Negative Negative   Ketones, UA Negative Negative   RBC, UA 1+ (A) Negative   Bilirubin, UA Negative Negative   Urobilinogen, Ur 0.2 0.2 - 1.0 mg/dL   Nitrite, UA Negative Negative   Microscopic Examination See below:     Comment: Microscopic was indicated and was performed.   Urinalysis Reflex WILL FOLLOW   Microscopic Examination     Status: None (Preliminary result)   Collection Time:  12/24/21  2:54 PM   Urine  Result Value Ref Range   WBC, UA WILL FOLLOW    RBC WILL FOLLOW    Epithelial Cells (non renal) WILL FOLLOW    Renal Epithel, UA WILL FOLLOW    Casts WILL FOLLOW    Cast Type WILL FOLLOW    Crystals WILL FOLLOW    Crystal Type WILL FOLLOW    Mucus, UA WILL FOLLOW    Bacteria, UA WILL FOLLOW    Yeast, UA WILL FOLLOW    Trichomonas, UA WILL FOLLOW    Urinalysis Comments WILL FOLLOW         Assessment/Plan: 1. Encounter for general adult medical examination with abnormal findings CPE performed, routine fasting labs previously reviewed  2. Hypertension secondary to other renal disorders Stable, continue current medications. HR normally well controlled on metoprolol. - lisinopril (ZESTRIL) 10 MG tablet; Take 1 tablet (10 mg total) by mouth daily.  Dispense: 90 tablet; Refill: 1  3. Stage 3a chronic kidney disease ([place]) Will refer to nephrology - Ambulatory referral to Nephrology  4. Atrophic kidney, acquired Will refer to nephrology locally - Ambulatory referral to Nephrology  5. Post-traumatic urethral stricture, male, unspecified Followed by [facility] urology  6. Need for Tdap vaccination - Tdap ([place]) 5-2.5-18.5 LF-MCG/0.5 injection; Inject 0.5 mLs into the muscle once for 1 dose.  Dispense: 0.5 mL; Refill: 0  7. Dysuria - UA/M w/rflx Culture, Routine  8. Obesity (BMI 30-39.9) Working on increasing exercise and getting into cooking routine in an effort to work on  [employer] loss   General Counseling: Kosei verbalizes understanding of the findings of todays visit and agrees with plan of treatment. I have discussed any further diagnostic evaluation that may be needed or ordered today. We also reviewed his medications today. he has been encouraged to call the office with any questions or concerns that should arise related to todays visit.    Counseling:    Orders Placed This Encounter  Procedures   Microscopic Examination   UA/M w/rflx Culture, Routine   Ambulatory referral to Nephrology    Meds ordered this encounter  Medications   Tdap (BOOSTRIX) 5-2.5-18.5 LF-MCG/0.5 injection    Sig: Inject 0.5 mLs into the muscle once for 1 dose.    Dispense:  0.5 mL    Refill:  0   lisinopril (ZESTRIL) 10 MG tablet    Sig: Take 1 tablet (10 mg total) by mouth daily.    Dispense:  90 tablet    Refill:  1    PLEASE provide 90 day supply. Patient should take 15 mg total, one 5 mg and one 10 mg daily.    This patient was seen by Drema Dallas, PA-C in collaboration with Dr. Clayborn Bigness as a part of collaborative care agreement.  Total time spent:30 Minutes  Time spent includes review of chart, medications, test results, and follow up plan with the patient.     Lavera Guise, MD  Internal Medicine

## 2021-12-26 LAB — UA/M W/RFLX CULTURE, ROUTINE
Bilirubin, UA: NEGATIVE
Glucose, UA: NEGATIVE
Ketones, UA: NEGATIVE
Nitrite, UA: NEGATIVE
Specific Gravity, UA: 1.02 (ref 1.005–1.030)
Urobilinogen, Ur: 0.2 mg/dL (ref 0.2–1.0)
pH, UA: 6.5 (ref 5.0–7.5)

## 2021-12-26 LAB — MICROSCOPIC EXAMINATION: Casts: NONE SEEN /lpf

## 2021-12-26 LAB — URINE CULTURE, REFLEX

## 2021-12-27 NOTE — Telephone Encounter (Signed)
Nephrology referral sent via [facility] to [facility] 

## 2022-01-03 ENCOUNTER — Telehealth: Payer: Self-pay

## 2022-01-03 NOTE — Telephone Encounter (Signed)
LMOM for pt to return call to discuss his UA dip

## 2022-01-03 NOTE — Telephone Encounter (Signed)
-----   Message from Mylinda Latina, PA-C sent at 01/02/2022  3:05 PM EST ----- Please let him know that his urine dip did show possible signs of infection, however the lab was unable to perform a culture on it. If he is having any symptoms we can send an order for culture to [facility] or he can follow up with his urologist.

## 2022-01-04 ENCOUNTER — Other Ambulatory Visit: Payer: Self-pay | Admitting: Physician Assistant

## 2022-01-04 ENCOUNTER — Telehealth: Payer: Self-pay

## 2022-01-04 DIAGNOSIS — R300 Dysuria: Secondary | ICD-10-CM

## 2022-01-04 NOTE — Telephone Encounter (Signed)
Order sent.

## 2022-01-04 NOTE — Telephone Encounter (Signed)
-----   Message from Mylinda Latina, PA-C sent at 01/02/2022  3:05 PM EST ----- Please let him know that his urine dip did show possible signs of infection, however the lab was unable to perform a culture on it. If he is having any symptoms we can send an order for culture to [facility] or he can follow up with his urologist.

## 2022-01-04 NOTE — Telephone Encounter (Signed)
Spoke to pt and informed him of lab results and he advised his urine is very strong smelling and would like to have a UA culture lab done.  Lauren will place lab order for pt to go have a UA culture done at [facility]

## 2022-01-04 NOTE — Telephone Encounter (Signed)
LMOM for pt to return my call.

## 2022-01-04 NOTE — Telephone Encounter (Signed)
-----   Message from Lauren K McDonough, PA-C sent at 01/02/2022  3:05 PM EST ----- °Please let him know that his urine dip did show possible signs of infection, however the lab was unable to perform a culture on it. If he is having any symptoms we can send an order for culture to [facility] or he can follow up with his urologist. °

## 2022-01-10 ENCOUNTER — Other Ambulatory Visit: Payer: Self-pay | Admitting: Physician Assistant

## 2022-01-10 DIAGNOSIS — N390 Urinary tract infection, site not specified: Secondary | ICD-10-CM

## 2022-01-10 DIAGNOSIS — R319 Hematuria, unspecified: Secondary | ICD-10-CM

## 2022-01-10 LAB — UA/M W/RFLX CULTURE, COMP
Bilirubin, UA: NEGATIVE
Glucose, UA: NEGATIVE
Ketones, UA: NEGATIVE
Nitrite, UA: POSITIVE — AB
Specific Gravity, UA: 1.017 (ref 1.005–1.030)
Urobilinogen, Ur: 0.2 mg/dL (ref 0.2–1.0)
pH, UA: 6.5 (ref 5.0–7.5)

## 2022-01-10 LAB — MICROSCOPIC EXAMINATION
Epithelial Cells (non renal): NONE SEEN /hpf (ref 0–10)
WBC, UA: >30 /hpf — AB (ref 0–5)

## 2022-01-10 LAB — URINE CULTURE, COMPREHENSIVE

## 2022-01-10 MED ORDER — SULFAMETHOXAZOLE-TRIMETHOPRIM 800-160 MG PO TABS: 1.0000 | ORAL_TABLET | Freq: Two times a day (BID) | ORAL | 0 refills | Status: DC

## 2022-01-11 NOTE — Progress Notes (Signed)
Pt notified for urine labs result and advised to follow up with his urologist

## 2022-01-14 NOTE — Telephone Encounter (Signed)
Per [place], vm left 01/04/22 & 01/09/22 for patient to return their call to schedule. I spoke with patient's mother. Gave her telephone # to call and schedule-Austin Stevenson

## 2022-01-24 NOTE — Telephone Encounter (Signed)
[facility] closed referral due to patient not returning calls to schedule-[facility]oni

## 2022-06-24 ENCOUNTER — Encounter: Payer: Self-pay | Admitting: Physician Assistant

## 2022-06-24 ENCOUNTER — Other Ambulatory Visit: Payer: Self-pay | Admitting: Physician Assistant

## 2022-06-24 ENCOUNTER — Ambulatory Visit: Payer: Medicaid Other | Admitting: Physician Assistant

## 2022-06-24 VITALS — BP 144/89 | HR 131 | Temp 98.4°F | Resp 16 | Ht 72.0 in | Wt 311.8 lb

## 2022-06-24 DIAGNOSIS — N261 Atrophy of kidney (terminal): Secondary | ICD-10-CM

## 2022-06-24 DIAGNOSIS — Z91148 Patient's other noncompliance with medication regimen for other reason: Secondary | ICD-10-CM

## 2022-06-24 DIAGNOSIS — N35014 Post-traumatic urethral stricture, male, unspecified: Secondary | ICD-10-CM

## 2022-06-24 DIAGNOSIS — N1831 Chronic kidney disease, stage 3a: Secondary | ICD-10-CM

## 2022-06-24 DIAGNOSIS — Z6841 Body Mass Index (BMI) 40.0 and over, adult: Secondary | ICD-10-CM

## 2022-06-24 DIAGNOSIS — I151 Hypertension secondary to other renal disorders: Secondary | ICD-10-CM | POA: Diagnosis not present

## 2022-06-24 DIAGNOSIS — N2889 Other specified disorders of kidney and ureter: Secondary | ICD-10-CM

## 2022-06-24 DIAGNOSIS — R000 Tachycardia, unspecified: Secondary | ICD-10-CM | POA: Diagnosis not present

## 2022-06-24 MED ORDER — LISINOPRIL 20 MG PO TABS: 20.0000 mg | ORAL_TABLET | Freq: Every day | ORAL | 3 refills | Status: DC

## 2022-06-24 MED ORDER — METOPROLOL SUCCINATE ER 50 MG PO TB24: 50.0000 mg | ORAL_TABLET | Freq: Every day | ORAL | 1 refills | Status: DC

## 2022-06-24 NOTE — Progress Notes (Signed)
[facility] [place], [place] [address]  Internal MEDICINE  Office Visit Note  Patient Name: Austin Stevenson  [id number]  [id number]  Date of Service: 06/24/2022  Chief Complaint  Patient presents with   Follow-up    Has not taken Metropolol in a while, HR is high   Hypertension   Medication Refill    Needs refill for Lisinopril   Quality Metric Gaps    Tetanus/TDAP    HPI Pt is here for routine follow up -Has not taken metoprolol recently and reports only taking lisinopril occasionally. Did take lisinopril today, but states they have been only filling the 10mg  and he takes 2 for total of 20mg  instead of the 15mg . States he needs refills of both. For the metoprolol he ran out and then forgot about it and admits to being bad about taking his meds as prescribed. Discussed the importance of this. -HR elevated in office, but reports no symptoms. Denies palpitations, fatigue, or dizziness -Never went to nephrology--thinks he may have gotten a call and forgot about it, still sees urology as before, but again they recommended he establish with adult nephrology as well and will send new referral today for local provider  Current Medication: Outpatient Encounter Medications as of 06/24/2022  Medication Sig   lisinopril (ZESTRIL) 20 MG tablet Take 1 tablet (20 mg total) by mouth daily.   sulfamethoxazole-trimethoprim (BACTRIM DS) 800-160 MG tablet Take 1 tablet by mouth 2 (two) times daily.   [DISCONTINUED] lisinopril (ZESTRIL) 10 MG tablet Take 1 tablet (10 mg total) by mouth daily.   [DISCONTINUED] lisinopril (ZESTRIL) 5 MG tablet TAKE 1 TABLET BY MOUTH ONCE DAILY. TAKE A 5 MG AND 10 MG TABLET FOR TOTAL OF 15 MG.   [DISCONTINUED] metoprolol succinate (TOPROL-XL) 50 MG 24 hr tablet Take 1 tablet (50 mg total) by mouth daily. Take with or immediately following a meal.   metoprolol succinate (TOPROL-XL) 50 MG 24 hr tablet Take 1 tablet (50 mg total) by mouth daily. Take  with or immediately following a meal.   No facility-administered encounter medications on file as of 06/24/2022.    Surgical History: Past Surgical History:  Procedure Laterality Date   ABDOMINAL SURGERY     ABSCESS DRAINAGE  11/15/2005   creation of cathetrizable stoma     cystopic catheter placement   11/0/2010   CYSTOURETHROSCOPY     dilation of stricture  09/12/2008   EPISPADIUS CORRECTION     ESOPHAGOGASTRODUODENOSCOPY (EGD) WITH PROPOFOL N/A 11/25/2016   Procedure: ESOPHAGOGASTRODUODENOSCOPY (EGD) WITH PROPOFOL;  Surgeon: Lucilla Lame, MD;  Location: [facility] ENDOSCOPY;  Service: Endoscopy;  Laterality: N/A;   MITROFANOFF PROCEDURE  09/10/2005   SUPRAPUBIC CATHETER PLACEMENT  09/28/2005   TRANSRECTAL DRAINAGE OF PELVIC ABSCESS     URETHRAL DILATION      Medical History: 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    Urethral fistula     Family History: Family History  Problem Relation Age of Onset   Diabetes Mother    Hypertension Paternal Grandfather     Social History   Socioeconomic History   Marital status: Single    Spouse name: Not on file   Number of children: Not on file   Years of education: Not on file   Highest education level: Not on file  Occupational History  Not on file  Tobacco Use   Smoking status: Every Day    Types: Cigarettes   Smokeless tobacco: Never  Vaping Use   Vaping Use: Every day   Devices: e cigarettes   Substance and Sexual Activity   Alcohol use: No   Drug use: No   Sexual activity: Yes  Other Topics Concern   Not on file  Social History Narrative   Not on file   Social Determinants of Health   Financial Resource Strain: Not on file  Food Insecurity: Not on file  Transportation Needs: Not on file  Physical Activity: Not on file  Stress: Not on file  Social Connections: Not on file  Intimate  Partner Violence: Not on file      Review of Systems  Constitutional:  Negative for chills, fatigue and unexpected weight change.  HENT:  Negative for congestion, postnasal drip, rhinorrhea, sneezing and sore throat.   Eyes:  Negative for redness.  Respiratory:  Negative for cough, chest tightness and shortness of breath.   Cardiovascular:  Negative for chest pain and palpitations.  Gastrointestinal:  Negative for abdominal pain, constipation, diarrhea, nausea and vomiting.  Genitourinary:  Negative for dysuria and frequency.  Musculoskeletal:  Negative for arthralgias, back pain, joint swelling and neck pain.  Skin:  Negative for rash.  Neurological: Negative.  Negative for tremors and numbness.  Hematological:  Negative for adenopathy. Does not bruise/bleed easily.  Psychiatric/Behavioral:  Negative for behavioral problems (Depression), sleep disturbance and suicidal ideas. The patient is not nervous/anxious.     Vital Signs: BP (!) 144/89   Pulse (!) 131   Temp 98.4 F (36.9 C)   Resp 16   Ht 6' (1.829 m)   Wt (!) 311 lb 12.8 oz (141.4 kg)   SpO2 97%   BMI 42.29 kg/m    Physical Exam Vitals and nursing note reviewed.  Constitutional:      General: He is not in acute distress.    Appearance: He is well-developed. He is obese. He is not diaphoretic.  HENT:     Head: Normocephalic and atraumatic.     Mouth/Throat:     Pharynx: No oropharyngeal exudate.  Eyes:     Pupils: Pupils are equal, round, and reactive to light.  Neck:     Thyroid: No thyromegaly.     Vascular: No JVD.     Trachea: No tracheal deviation.  Cardiovascular:     Rate and Rhythm: Regular rhythm. Tachycardia present.     Heart sounds: Normal heart sounds. No murmur heard.    No friction rub. No gallop.  Pulmonary:     Effort: Pulmonary effort is normal. No respiratory distress.     Breath sounds: No wheezing or rales.  Chest:     Chest wall: No tenderness.  Abdominal:     General: Bowel  sounds are normal.     Palpations: Abdomen is soft.     Tenderness: There is no abdominal tenderness.  Musculoskeletal:        General: Normal range of motion.     Cervical back: Normal range of motion and neck supple.  Lymphadenopathy:     Cervical: No cervical adenopathy.  Skin:    General: Skin is warm and dry.  Neurological:     Mental Status: He is alert and oriented to person, place, and time.     Cranial Nerves: No cranial nerve deficit.  Psychiatric:        Behavior: Behavior normal.  Thought Content: Thought content normal.        Judgment: Judgment normal.        Assessment/Plan: 1. Hypertension secondary to other renal disorders Elevated in office, but patient has only been taking lisinopril sometimes and none of his metoprolol recently. Discussed the importance of medication adherence and will go ahead and send higher dose of lisinopril as he takes 2 tabs of 10mg  when he has been taking it. Will have follow up in 1 month - metoprolol succinate (TOPROL-XL) 50 MG 24 hr tablet; Take 1 tablet (50 mg total) by mouth daily. Take with or immediately following a meal.  Dispense: 90 tablet; Refill: 1 - lisinopril (ZESTRIL) 20 MG tablet; Take 1 tablet (20 mg total) by mouth daily.  Dispense: 90 tablet; Refill: 3 - Ambulatory referral to Nephrology  2. Tachycardia Has not been taking his metoprolol for awhile and is asymptomatic, but will pick up med today and restart. Discussed calling  office or going to ED if symptoms arise. Also advised to stay well hydrated. Will have follow up in 1 month.  3. Stage 3a chronic kidney disease ([facility]) Will refer to nephrology locally - Ambulatory referral to Nephrology  4. Atrophic kidney, acquired Will refer to nephrology locally - Ambulatory referral to Nephrology  5. Post-traumatic urethral stricture, male, unspecified Followed by [facility] urology  6. Morbid obesity with BMI of 40.0-44.9, adult (HCC) Will work on improving diet and  exercise in an effort to lose weight  7. Nonadherence to medication Discussed the importance of taking medications as prescribed and the negative health consequences of not doing so. Pt expressed understanding and will try to do better  General Counseling: 09-28-1980 understanding of the findings of todays visit and agrees with plan of treatment. I have discussed any further diagnostic evaluation that may be needed or ordered today. We also reviewed his medications today. he has been encouraged to call the office with any questions or concerns that should arise related to todays visit.    Orders Placed This Encounter  Procedures   Ambulatory referral to Nephrology    Meds ordered this encounter  Medications   metoprolol succinate (TOPROL-XL) 50 MG 24 hr tablet    Sig: Take 1 tablet (50 mg total) by mouth daily. Take with or immediately following a meal.    Dispense:  90 tablet    Refill:  1   lisinopril (ZESTRIL) 20 MG tablet    Sig: Take 1 tablet (20 mg total) by mouth daily.    Dispense:  90 tablet    Refill:  3    This patient was seen by Jeanell Sparrow, PA-C in collaboration with Dr. Lynn Ito as a part of collaborative care agreement.   Total time spent:30 Minutes Time spent includes review of chart, medications, test results, and follow up plan with the patient.      Dr Beverely Risen Internal medicine

## 2022-06-24 NOTE — Telephone Encounter (Signed)
Please see

## 2022-07-08 ENCOUNTER — Telehealth: Payer: Self-pay

## 2022-07-08 ENCOUNTER — Ambulatory Visit: Payer: Medicaid Other | Admitting: Nurse Practitioner

## 2022-07-08 ENCOUNTER — Encounter: Payer: Self-pay | Admitting: Nurse Practitioner

## 2022-07-08 VITALS — BP 100/67 | HR 120 | Temp 99.0°F | Resp 16 | Ht 72.0 in | Wt 297.0 lb

## 2022-07-08 DIAGNOSIS — N3001 Acute cystitis with hematuria: Secondary | ICD-10-CM | POA: Diagnosis not present

## 2022-07-08 DIAGNOSIS — N35014 Post-traumatic urethral stricture, male, unspecified: Secondary | ICD-10-CM

## 2022-07-08 DIAGNOSIS — R300 Dysuria: Secondary | ICD-10-CM

## 2022-07-08 LAB — POCT URINALYSIS DIPSTICK
Glucose, UA: NEGATIVE
Nitrite, UA: NEGATIVE
Protein, UA: POSITIVE — AB
Spec Grav, UA: 1.015 (ref 1.010–1.025)
Urobilinogen, UA: 0.2 E.U./dL
pH, UA: 6 (ref 5.0–8.0)

## 2022-07-08 MED ORDER — SULFAMETHOXAZOLE-TRIMETHOPRIM 800-160 MG PO TABS: 1.0000 | ORAL_TABLET | Freq: Two times a day (BID) | ORAL | 0 refills | Status: AC

## 2022-07-08 NOTE — Telephone Encounter (Signed)
Pt called that he had UTI made appt today

## 2022-07-08 NOTE — Progress Notes (Unsigned)
[facility] [address] [place], [place] [address]  Internal MEDICINE  Office Visit Note  Patient Name: Austin Stevenson  [id number]  [id number]  Date of Service: 07/08/2022  Chief Complaint  Patient presents with   Follow-up    UTI   Back Pain     HPI Carolyn presents for an acute sick visit for  Bactrim  100/67 HR 120 tachy and febrile No CVAT,  Flank pain bilateral  103.4 fever at home Tchycardia, slightly BP, febrile.   Current Medication:  Outpatient Encounter Medications as of 07/08/2022  Medication Sig   lisinopril (ZESTRIL) 20 MG tablet Take 1 tablet (20 mg total) by mouth daily.   metoprolol succinate (TOPROL-XL) 50 MG 24 hr tablet Take 1 tablet (50 mg total) by mouth daily. Take with or immediately following a meal.   sulfamethoxazole-trimethoprim (BACTRIM DS) 800-160 MG tablet Take 1 tablet by mouth 2 (two) times daily for 7 days. Take with food.   [DISCONTINUED] sulfamethoxazole-trimethoprim (BACTRIM DS) 800-160 MG tablet Take 1 tablet by mouth 2 (two) times daily.   No facility-administered encounter medications on file as of 07/08/2022.      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  Respiratory:  Negative for cough, chest tightness, shortness of breath and wheezing.   Cardiovascular: Negative.  Negative for chest pain and palpitations.  Gastrointestinal:  Positive for nausea and vomiting. Negative for diarrhea.  Genitourinary:  Positive for decreased urine volume, difficulty urinating, dysuria, flank pain, frequency and urgency.   Musculoskeletal:  Positive for back pain and myalgias.    Physical Exam Constitutional:      General: He is not in acute distress.    Appearance: Normal appearance. He is obese. He is ill-appearing.  HENT:     Head: Normocephalic and atraumatic.  Eyes:     Pupils: Pupils are equal, round, and reactive to light.  Cardiovascular:     Rate and Rhythm: Regular rhythm. Tachycardia present.  Pulmonary:     Effort: Pulmonary effort is normal. No respiratory distress.  Abdominal:     Tenderness: There is no right CVA tenderness or left CVA tenderness.  Neurological:     Mental Status: He is alert and oriented to person, place, and time.  Psychiatric:        Mood and Affect: Mood normal.        Behavior: Behavior normal.       Assessment/Plan:   General Counseling: Jeanell Sparrow understanding of the findings of todays visit and agrees with plan of treatment. I have discussed any further diagnostic evaluation that may be needed or ordered today. We also reviewed his medications today. he has been encouraged to call the office with any questions or concerns that should arise related to todays visit.    Counseling:    Orders Placed This Encounter  Procedures   POCT Urinalysis Dipstick    Meds ordered this encounter  Medications   sulfamethoxazole-trimethoprim (BACTRIM DS) 800-160 MG tablet    Sig: Take 1 tablet by mouth 2 (two) times daily for 7 days. Take with food.    Dispense:  14 tablet    Refill:  0    Return if symptoms worsen or fail to improve.  [place] Controlled Substance Database was reviewed by me for overdose risk score (ORS)  Time spent:*** Minutes Time spent with patient included reviewing progress notes, labs, imaging studies, and discussing plan for follow up.   This patient was seen by Sallyanne Kuster, FNP-C in collaboration with Dr. Beverely Risen as a part of collaborative care agreement.  Adreanna Fickel R. Tedd Sias, MSN, FNP-C Internal Medicine

## 2022-07-09 ENCOUNTER — Telehealth: Payer: Self-pay

## 2022-07-09 ENCOUNTER — Encounter: Payer: Self-pay | Admitting: Nurse Practitioner

## 2022-07-09 NOTE — Telephone Encounter (Signed)
Nephrology referral sent via [facility] to [facility] 

## 2022-07-12 ENCOUNTER — Telehealth: Payer: Self-pay

## 2022-07-12 LAB — CULTURE, URINE COMPREHENSIVE

## 2022-07-12 NOTE — Telephone Encounter (Signed)
Guissepe called from [facility] that pt is admitted and they are looking for culture result I faxed [phone]

## 2022-07-25 ENCOUNTER — Ambulatory Visit: Payer: Medicaid Other | Admitting: Physician Assistant

## 2022-07-25 ENCOUNTER — Encounter: Payer: Self-pay | Admitting: Physician Assistant

## 2022-07-25 DIAGNOSIS — R000 Tachycardia, unspecified: Secondary | ICD-10-CM | POA: Diagnosis not present

## 2022-07-25 DIAGNOSIS — N1831 Chronic kidney disease, stage 3a: Secondary | ICD-10-CM

## 2022-07-25 DIAGNOSIS — N2889 Other specified disorders of kidney and ureter: Secondary | ICD-10-CM

## 2022-07-25 DIAGNOSIS — I151 Hypertension secondary to other renal disorders: Secondary | ICD-10-CM | POA: Diagnosis not present

## 2022-07-25 DIAGNOSIS — N261 Atrophy of kidney (terminal): Secondary | ICD-10-CM | POA: Diagnosis not present

## 2022-07-25 NOTE — Progress Notes (Signed)
[facility] [address] [place], [place] [address]  Internal MEDICINE  Office Visit Note  Patient Name: Austin Stevenson  [id number]  [id number]  Date of Service: 07/25/2022  Chief Complaint  Patient presents with   Follow-up   Hypertension   Labs Only    Pt would like labs done due to recent hospital visit in July    HPI Pt is here for routine follow up for HTN -He was recently in the hospital for UTI and AKI. He was told to stop lisinopril until renal function rechecked. He was supposed to follow up with [facility] urology for this, but since he had appt here he cancelled and requests renal function be rechecked on blood work.  -Still hasnt heard from local Kidney referral, will give him number to call directly to schedule as he needs to establish with nephrology and would prefer local provider than driving out to [facility] for this as well -States he also stopped his metoprolol when his lisinopril was stopped which is why HR still elevated, Will restart this and hold lisinopril still until blood work results come in  Current Medication: Outpatient Encounter Medications as of 07/25/2022  Medication Sig   lisinopril (ZESTRIL) 20 MG tablet Take 1 tablet (20 mg total) by mouth daily.   metoprolol succinate (TOPROL-XL) 50 MG 24 hr tablet Take 1 tablet (50 mg total) by mouth daily. Take with or immediately following a meal.   No facility-administered encounter medications on file as of 07/25/2022.    Surgical History: Past Surgical History:  Procedure Laterality Date   ABDOMINAL SURGERY     ABSCESS DRAINAGE  11/15/2005   creation of cathetrizable stoma     cystopic catheter placement   11/0/2010   CYSTOURETHROSCOPY     dilation of stricture  09/12/2008   EPISPADIUS CORRECTION     ESOPHAGOGASTRODUODENOSCOPY (EGD) WITH PROPOFOL N/A 11/25/2016   Procedure: ESOPHAGOGASTRODUODENOSCOPY (EGD) WITH PROPOFOL;  Surgeon: Lucilla Lame, MD;  Location: [facility] ENDOSCOPY;  Service: Endoscopy;   Laterality: N/A;   MITROFANOFF PROCEDURE  09/10/2005   SUPRAPUBIC CATHETER PLACEMENT  09/28/2005   TRANSRECTAL DRAINAGE OF PELVIC ABSCESS     URETHRAL DILATION      Medical History: 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    Urethral fistula     Family History: Family History  Problem Relation Age of Onset   Diabetes Mother    Hypertension Paternal Grandfather     Social History   Socioeconomic History   Marital status: Single    Spouse name: Not on file   Number of children: Not on file   Years of education: Not on file   Highest education level: Not on file  Occupational History   Not on file  Tobacco Use   Smoking status: Every Day    Types: Cigarettes   Smokeless tobacco: Never  Vaping Use   Vaping Use: Every day   Devices: e cigarettes   Substance and Sexual Activity   Alcohol use: No   Drug use: No   Sexual activity: Yes  Other Topics Concern   Not on file  Social History Narrative   Not on file   Social Determinants of Health   Financial Resource Strain: Not on file  Food Insecurity: Not on file  Transportation Needs: Not on file  Physical Activity:  Not on file  Stress: Not on file  Social Connections: Not on file  Intimate Partner Violence: Not on file      Review of Systems  Constitutional:  Negative for chills, fatigue and unexpected weight change.  HENT:  Negative for congestion, postnasal drip, rhinorrhea, sneezing and sore throat.   Eyes:  Negative for redness.  Respiratory:  Negative for cough, chest tightness and shortness of breath.   Cardiovascular:  Negative for chest pain and palpitations.  Gastrointestinal:  Negative for abdominal pain, constipation, diarrhea, nausea and vomiting.  Genitourinary:  Negative for dysuria and frequency.  Musculoskeletal:  Negative for arthralgias, back pain, joint swelling and  neck pain.  Skin:  Negative for rash.  Neurological: Negative.  Negative for tremors and numbness.  Hematological:  Negative for adenopathy. Does not bruise/bleed easily.  Psychiatric/Behavioral:  Negative for behavioral problems (Depression), sleep disturbance and suicidal ideas. The patient is not nervous/anxious.     Vital Signs: BP 130/80   Pulse (!) 103   Temp 98.3 F (36.8 C)   Resp 16   Ht 6' (1.829 m)   Wt (!) 304 lb 9.6 oz (138.2 kg)   SpO2 97%   BMI 41.31 kg/m    Physical Exam Vitals and nursing note reviewed.  Constitutional:      General: He is not in acute distress.    Appearance: Normal appearance. He is well-developed. He is obese. He is not diaphoretic.  HENT:     Head: Normocephalic and atraumatic.     Mouth/Throat:     Pharynx: No oropharyngeal exudate.  Eyes:     Pupils: Pupils are equal, round, and reactive to light.  Neck:     Thyroid: No thyromegaly.     Vascular: No JVD.     Trachea: No tracheal deviation.  Cardiovascular:     Rate and Rhythm: Regular rhythm. Tachycardia present.     Heart sounds: Normal heart sounds. No murmur heard.    No friction rub. No gallop.  Pulmonary:     Effort: Pulmonary effort is normal. No respiratory distress.     Breath sounds: No wheezing or rales.  Chest:     Chest wall: No tenderness.  Abdominal:     General: Bowel sounds are normal.     Palpations: Abdomen is soft.     Tenderness: There is no abdominal tenderness.  Musculoskeletal:        General: Normal range of motion.     Cervical back: Normal range of motion and neck supple.  Lymphadenopathy:     Cervical: No cervical adenopathy.  Skin:    General: Skin is warm and dry.  Neurological:     Mental Status: He is alert and oriented to person, place, and time.     Cranial Nerves: No cranial nerve deficit.  Psychiatric:        Behavior: Behavior normal.        Thought Content: Thought content normal.        Judgment: Judgment normal.         Assessment/Plan: 1. Hypertension secondary to other renal disorders Will restart metoprolol and will hold lisinopril until CMP results come back  2. Tachycardia Restart metoprolol  3. Stage 3a chronic kidney disease (HCC) Will recheck labs after recent AKI, pt needs to establish with nephrology and pt will contact office directly to schedule - Comprehensive metabolic panel  4. Atrophic kidney, acquired Will follow up on nephrology referral - Comprehensive metabolic panel   General Counseling:  Banks verbalizes understanding of the findings of todays visit and agrees with plan of treatment. I have discussed any further diagnostic evaluation that may be needed or ordered today. We also reviewed his medications today. he has been encouraged to call the office with any questions or concerns that should arise related to todays visit.    Orders Placed This Encounter  Procedures   Comprehensive metabolic panel    No orders of the defined types were placed in this encounter.   This patient was seen by Lynn Ito, PA-C in collaboration with Dr. Beverely Risen as a part of collaborative care agreement.   Total time spent:30 Minutes Time spent includes review of chart, medications, test results, and follow up plan with the patient.      Dr Lyndon Code Internal medicine

## 2022-07-26 ENCOUNTER — Telehealth: Payer: Self-pay

## 2022-07-26 LAB — COMPREHENSIVE METABOLIC PANEL
ALT: 21 IU/L (ref 0–44)
AST: 20 IU/L (ref 0–40)
Albumin/Globulin Ratio: 1.3 (ref 1.2–2.2)
Albumin: 4.4 g/dL (ref 4.3–5.2)
Alkaline Phosphatase: 85 IU/L (ref 44–121)
BUN/Creatinine Ratio: 11 (ref 9–20)
BUN: 17 mg/dL (ref 6–20)
Bilirubin Total: 0.5 mg/dL (ref 0.0–1.2)
CO2: 24 mmol/L (ref 20–29)
Calcium: 10.2 mg/dL (ref 8.7–10.2)
Chloride: 104 mmol/L (ref 96–106)
Creatinine, Ser: 1.5 mg/dL — ABNORMAL HIGH (ref 0.76–1.27)
Globulin, Total: 3.5 g/dL (ref 1.5–4.5)
Glucose: 74 mg/dL (ref 70–99)
Potassium: 4.9 mmol/L (ref 3.5–5.2)
Sodium: 144 mmol/L (ref 134–144)
Total Protein: 7.9 g/dL (ref 6.0–8.5)
eGFR: 66 mL/min/{1.73_m2} (ref >59)

## 2022-07-26 NOTE — Telephone Encounter (Signed)
-----   Message from Carlean Jews, PA-C sent at 07/26/2022 12:56 PM EDT ----- Please let him know his creatinine is back at baseline and can resume lisinopril and should establish with nephrology as planned

## 2022-07-26 NOTE — Telephone Encounter (Signed)
Left message for patient notifying them about lab results.

## 2022-08-07 ENCOUNTER — Telehealth: Payer: Self-pay

## 2022-08-07 NOTE — Telephone Encounter (Signed)
Nephrology appt 09/10/22 @ [address]

## 2022-10-24 ENCOUNTER — Ambulatory Visit: Payer: Medicaid Other | Admitting: Physician Assistant

## 2022-10-31 ENCOUNTER — Ambulatory Visit: Payer: Medicaid Other | Admitting: Physician Assistant

## 2022-10-31 ENCOUNTER — Encounter: Payer: Self-pay | Admitting: Physician Assistant

## 2022-10-31 VITALS — BP 142/85 | HR 99 | Temp 98.5°F | Resp 16 | Ht 72.0 in | Wt 319.6 lb

## 2022-10-31 DIAGNOSIS — R000 Tachycardia, unspecified: Secondary | ICD-10-CM | POA: Diagnosis not present

## 2022-10-31 DIAGNOSIS — I151 Hypertension secondary to other renal disorders: Secondary | ICD-10-CM

## 2022-10-31 DIAGNOSIS — N1831 Chronic kidney disease, stage 3a: Secondary | ICD-10-CM

## 2022-10-31 DIAGNOSIS — Z91148 Patient's other noncompliance with medication regimen for other reason: Secondary | ICD-10-CM

## 2022-10-31 NOTE — Progress Notes (Signed)
[facility] [address] [place], [place] [address]  Internal MEDICINE  Office Visit Note  Patient Name: Austin Stevenson  [id number]  [id number]  Date of Service: 10/31/2022  Chief Complaint  Patient presents with   Follow-up   Hypertension    HPI Pt is here for routine follow up -He reports noncompliance with his medication. He just started back taking them which is why BP and HR still a little high. Discussed setting a daily reminder on his phone to remember to take his meds daily -He knows he needs to take these medications, but he gets busy and forgets or recently was staying somewhere new and forgot to bring his meds with him and didn't go pick them up. -He is established with nephrology now and they will be monitor kidney function. He reports he was actually taking his meds then and his BP was well controlled at that visit. -Will need to get his own BP cuff, used to share with his grandfather when he lived with him -Will monitor and if still elevated with consistent use may need to adjust dosage  Current Medication: Outpatient Encounter Medications as of 10/31/2022  Medication Sig   lisinopril (ZESTRIL) 20 MG tablet Take 1 tablet (20 mg total) by mouth daily.   metoprolol succinate (TOPROL-XL) 50 MG 24 hr tablet Take 1 tablet (50 mg total) by mouth daily. Take with or immediately following a meal.   No facility-administered encounter medications on file as of 10/31/2022.    Surgical History: Past Surgical History:  Procedure Laterality Date   ABDOMINAL SURGERY     ABSCESS DRAINAGE  11/15/2005   creation of cathetrizable stoma     cystopic catheter placement   11/0/2010   CYSTOURETHROSCOPY     dilation of stricture  09/12/2008   EPISPADIUS CORRECTION     ESOPHAGOGASTRODUODENOSCOPY (EGD) WITH PROPOFOL N/A 11/25/2016   Procedure: ESOPHAGOGASTRODUODENOSCOPY (EGD) WITH PROPOFOL;  Surgeon: Midge Minium, MD;  Location: [facility] ENDOSCOPY;  Service: Endoscopy;   Laterality: N/A;   MITROFANOFF PROCEDURE  09/10/2005   SUPRAPUBIC CATHETER PLACEMENT  09/28/2005   TRANSRECTAL DRAINAGE OF PELVIC ABSCESS     URETHRAL DILATION      Medical History: 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    Urethral fistula     Family History: Family History  Problem Relation Age of Onset   Diabetes Mother    Hypertension Paternal Grandfather     Social History   Socioeconomic History   Marital status: Single    Spouse name: Not on file   Number of children: Not on file   Years of education: Not on file   Highest education level: Not on file  Occupational History   Not on file  Tobacco Use   Smoking status: Every Day    Types: Cigarettes   Smokeless tobacco: Never   Tobacco comments:    "Several times a day"  Vaping Use   Vaping Use: Every day   Devices: e cigarettes   Substance and Sexual Activity   Alcohol use: No   Drug use: No   Sexual activity: Yes  Other Topics Concern   Not on file  Social History Narrative   Not on file   Social Determinants of Health   Financial Resource Strain: Not on file  Food Insecurity: Not on file  Transportation Needs: Not on file  Physical Activity: Not on file  Stress: Not on file  Social Connections: Not on file  Intimate Partner Violence: Not on file      Review of Systems  Constitutional:  Negative for chills, fatigue and unexpected weight change.  HENT:  Negative for congestion, postnasal drip, rhinorrhea, sneezing and sore throat.   Eyes:  Negative for redness.  Respiratory:  Negative for cough, chest tightness and shortness of breath.   Cardiovascular:  Negative for chest pain and palpitations.  Gastrointestinal:  Negative for abdominal pain, constipation, diarrhea, nausea and vomiting.  Genitourinary:  Negative for dysuria and frequency.  Musculoskeletal:  Negative  for arthralgias, back pain, joint swelling and neck pain.  Skin:  Negative for rash.  Neurological: Negative.  Negative for tremors and numbness.  Hematological:  Negative for adenopathy. Does not bruise/bleed easily.  Psychiatric/Behavioral:  Negative for behavioral problems (Depression), sleep disturbance and suicidal ideas. The patient is not nervous/anxious.     Vital Signs: BP (!) 142/85   Pulse 99   Temp 98.5 F (36.9 C)   Resp 16   Ht 6' (1.829 m)   Wt (!) 319 lb 9.6 oz (145 kg)   SpO2 99%   BMI 43.35 kg/m    Physical Exam Vitals and nursing note reviewed.  Constitutional:      General: He is not in acute distress.    Appearance: Normal appearance. He is well-developed. He is obese. He is not diaphoretic.  HENT:     Head: Normocephalic and atraumatic.     Mouth/Throat:     Pharynx: No oropharyngeal exudate.  Eyes:     Pupils: Pupils are equal, round, and reactive to light.  Neck:     Thyroid: No thyromegaly.     Vascular: No JVD.     Trachea: No tracheal deviation.  Cardiovascular:     Rate and Rhythm: Normal rate and regular rhythm.     Heart sounds: Normal heart sounds. No murmur heard.    No friction rub. No gallop.  Pulmonary:     Effort: Pulmonary effort is normal. No respiratory distress.     Breath sounds: No wheezing or rales.  Chest:     Chest wall: No tenderness.  Abdominal:     Palpations: Abdomen is soft.  Musculoskeletal:        General: Normal range of motion.     Cervical back: Normal range of motion and neck supple.  Lymphadenopathy:     Cervical: No cervical adenopathy.  Skin:    General: Skin is warm and dry.  Neurological:     Mental Status: He is alert and oriented to person, place, and time.     Cranial Nerves: No cranial nerve deficit.  Psychiatric:        Behavior: Behavior normal.        Thought Content: Thought content normal.        Judgment: Judgment normal.        Assessment/Plan: 1. Hypertension secondary to other  renal disorders Borderline in office, has not been taking meds consistently and will work on improving this and monitoring BP at home  2. Tachycardia Borderline tachycardic in office today, but denies symptoms. Advised to take meds as prescribed  3. Stage 3a chronic kidney disease (HCC) Followed by nephrology  4. Nonadherence to medication Discussed ways to increase compliance with meds such as daily phone reminder. Discussed importance of taking meds as prescribed   General Counseling: Jeanell Sparrow  understanding of the findings of todays visit and agrees with plan of treatment. I have discussed any further diagnostic evaluation that may be needed or ordered today. We also reviewed his medications today. he has been encouraged to call the office with any questions or concerns that should arise related to todays visit.    No orders of the defined types were placed in this encounter.   No orders of the defined types were placed in this encounter.   This patient was seen by Lynn Ito, PA-C in collaboration with Dr. Beverely Risen as a part of collaborative care agreement.   Total time spent:30 Minutes Time spent includes review of chart, medications, test results, and follow up plan with the patient.      Dr Lyndon Code Internal medicine

## 2022-12-23 ENCOUNTER — Encounter: Payer: Medicaid Other | Admitting: Physician Assistant

## 2022-12-24 ENCOUNTER — Telehealth: Payer: Self-pay | Admitting: Physician Assistant

## 2022-12-24 NOTE — Telephone Encounter (Signed)
Left vm and sent mychart message to confirm 12/30/22 appointment-Toni

## 2022-12-30 ENCOUNTER — Encounter: Payer: Self-pay | Admitting: Physician Assistant

## 2022-12-30 ENCOUNTER — Ambulatory Visit (INDEPENDENT_AMBULATORY_CARE_PROVIDER_SITE_OTHER): Payer: Medicaid Other | Admitting: Physician Assistant

## 2022-12-30 VITALS — BP 140/90 | HR 102 | Temp 98.4°F | Resp 16 | Ht 72.0 in | Wt 328.0 lb

## 2022-12-30 DIAGNOSIS — I151 Hypertension secondary to other renal disorders: Secondary | ICD-10-CM

## 2022-12-30 DIAGNOSIS — R000 Tachycardia, unspecified: Secondary | ICD-10-CM | POA: Diagnosis not present

## 2022-12-30 DIAGNOSIS — N1831 Chronic kidney disease, stage 3a: Secondary | ICD-10-CM

## 2022-12-30 DIAGNOSIS — Z0001 Encounter for general adult medical examination with abnormal findings: Secondary | ICD-10-CM | POA: Diagnosis not present

## 2022-12-30 DIAGNOSIS — Z6841 Body Mass Index (BMI) 40.0 and over, adult: Secondary | ICD-10-CM

## 2022-12-30 DIAGNOSIS — Z91148 Patient's other noncompliance with medication regimen for other reason: Secondary | ICD-10-CM | POA: Diagnosis not present

## 2022-12-30 MED ORDER — TETANUS-DIPHTH-ACELL PERTUSSIS 5-2-15.5 LF-MCG/0.5 IM SUSP: 0.5000 mL | Freq: Once | INTRAMUSCULAR | 0 refills | Status: AC

## 2022-12-30 NOTE — Progress Notes (Signed)
[facility] [address] [place], [place] [address]  Internal MEDICINE  Office Visit Note  Patient Name: Austin Stevenson  [id number]  [id number]  Date of Service: 12/30/2022  Chief Complaint  Patient presents with   Annual Exam   Hypertension     HPI Pt is here for routine health maintenance examination -Not consistent with medication use, did take them this morning, but not yesterday and is sporadic with use. -Goes back to nephrology 01/13/23 and will likely have urine and blood work done then -Increase to 1.5 tabs metoprolol (75mg ) and continue 20mg  lisinopril. BP did not improve on recheck. If taking meds consistently and losing weight then may be able to decrease again--monitor closely -setting daily alarm for meds now and may start taking them with him to work just in case. -goal of 3lbs weight loss by next visit  Current Medication: Outpatient Encounter Medications as of 12/30/2022  Medication Sig   lisinopril (ZESTRIL) 20 MG tablet Take 1 tablet (20 mg total) by mouth daily.   metoprolol succinate (TOPROL-XL) 50 MG 24 hr tablet Take 1 tablet (50 mg total) by mouth daily. Take with or immediately following a meal.   [DISCONTINUED] Tdap (ADACEL) 04-16-14.5 LF-MCG/0.5 injection Inject 0.5 mLs into the muscle once.   Tdap (ADACEL) 04-16-14.5 LF-MCG/0.5 injection Inject 0.5 mLs into the muscle once for 1 dose.   No facility-administered encounter medications on file as of 12/30/2022.    Surgical History: Past Surgical History:  Procedure Laterality Date   ABDOMINAL SURGERY     ABSCESS DRAINAGE  11/15/2005   creation of cathetrizable stoma     cystopic catheter placement   11/0/2010   CYSTOURETHROSCOPY     dilation of stricture  09/12/2008   EPISPADIUS CORRECTION     ESOPHAGOGASTRODUODENOSCOPY (EGD) WITH PROPOFOL N/A 11/25/2016   Procedure: ESOPHAGOGASTRODUODENOSCOPY (EGD) WITH PROPOFOL;  Surgeon: 09/14/2008, MD;  Location: [facility] ENDOSCOPY;  Service: Endoscopy;   Laterality: N/A;   MITROFANOFF PROCEDURE  09/10/2005   SUPRAPUBIC CATHETER PLACEMENT  09/28/2005   TRANSRECTAL DRAINAGE OF PELVIC ABSCESS     URETHRAL DILATION      Medical History: 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  HENT:  Negative for congestion, postnasal drip, rhinorrhea, sneezing and sore throat.   Eyes:  Negative for redness.  Respiratory:  Negative for cough, chest tightness and shortness of breath.   Cardiovascular:  Negative for chest pain and palpitations.  Gastrointestinal:  Negative for abdominal pain, constipation, diarrhea, nausea and vomiting.  Genitourinary:  Negative for dysuria and frequency.  Musculoskeletal:  Negative for arthralgias, back pain, joint swelling and neck pain.  Skin:  Negative for rash.  Neurological: Negative.  Negative for tremors and numbness.  Hematological:  Negative for adenopathy. Does not bruise/bleed easily.  Psychiatric/Behavioral:  Negative for behavioral problems (Depression), sleep disturbance and suicidal ideas. The patient is not nervous/anxious.      Vital Signs: BP (!) 140/90   Pulse (!) 102   Temp 98.4 F (36.9 C)   Resp 16   Ht 6' (  1.829 m)   Wt (!) 328 lb (148.8 kg)   SpO2 98%   BMI 44.48 kg/m    Physical Exam Vitals and nursing note reviewed.  Constitutional:      General: He is not in acute distress.    Appearance: Normal appearance. He is well-developed. He is obese. He is not diaphoretic.  HENT:     Head: Normocephalic and atraumatic.     Mouth/Throat:     Pharynx: No oropharyngeal exudate.  Eyes:     Pupils: Pupils are equal,  round, and reactive to light.  Neck:     Thyroid: No thyromegaly.     Vascular: No JVD.     Trachea: No tracheal deviation.  Cardiovascular:     Rate and Rhythm: Normal rate and regular rhythm.     Heart sounds: Normal heart sounds. No murmur heard.    No friction rub. No gallop.  Pulmonary:     Effort: Pulmonary effort is normal. No respiratory distress.     Breath sounds: No wheezing or rales.  Chest:     Chest wall: No tenderness.  Abdominal:     Palpations: Abdomen is soft.     Tenderness: There is no abdominal tenderness.  Musculoskeletal:        General: Normal range of motion.     Cervical back: Normal range of motion and neck supple.  Lymphadenopathy:     Cervical: No cervical adenopathy.  Skin:    General: Skin is warm and dry.  Neurological:     Mental Status: He is alert and oriented to person, place, and time.     Cranial Nerves: No cranial nerve deficit.  Psychiatric:        Behavior: Behavior normal.        Thought Content: Thought content normal.        Judgment: Judgment normal.      LABS: No results found for this or any previous visit (from the past 2160 hour(s)).      Assessment/Plan: 1. Encounter for general adult medical examination with abnormal findings CPE performed  2. Hypertension secondary to other renal disorders Advised to take medications daily as prescribed. Will increase to 1.5tabs metoprolol (75mg ) daily and continue lisinopril as before  3. Tachycardia Increase to 1.5tab metoprolol daily and monitor  4. Stage 3a chronic kidney disease ([place]) Followed by nephrology  5. Nonadherence to medication Discussed the importance of medication adherence. Patient set daily alarm/reminder for medications while in office today to help with remembering.  6. Morbid obesity with BMI 40.0-44.9, adult ([place]) Discussed need for weight loss and changing diet and exercise patterns. Goal of at least 3lb weight loss by next visit.  General  Counseling: daeshawn redmann understanding of the findings of todays visit and agrees with plan of treatment. I have discussed any further diagnostic evaluation that may be needed or ordered today. We also reviewed his medications today. he has been encouraged to call the office with any questions or concerns that should arise related to todays visit.    Counseling:    No orders of the defined types were placed in this encounter.   Meds ordered this encounter  Medications   Tdap (ADACEL) 04-16-14.5 LF-MCG/0.5 injection    Sig: Inject 0.5 mLs into the muscle once for 1 dose.    Dispense:  0.5 mL    Refill:  0    This patient was seen by Drema Dallas, PA-C in collaboration with Dr. Clayborn Bigness as a part  of collaborative care agreement.  Total time spent:35 Minutes  Time spent includes review of chart, medications, test results, and follow up plan with the patient.     Lavera Guise, MD  Internal Medicine

## 2023-01-27 ENCOUNTER — Ambulatory Visit: Payer: Medicaid Other | Admitting: Physician Assistant

## 2023-02-10 ENCOUNTER — Encounter: Payer: Self-pay | Admitting: Physician Assistant

## 2023-02-10 ENCOUNTER — Ambulatory Visit: Payer: Medicaid Other | Admitting: Physician Assistant

## 2023-02-10 VITALS — BP 118/75 | HR 122 | Temp 98.3°F | Resp 16 | Ht 72.0 in | Wt 318.2 lb

## 2023-02-10 DIAGNOSIS — I151 Hypertension secondary to other renal disorders: Secondary | ICD-10-CM

## 2023-02-10 DIAGNOSIS — Z6841 Body Mass Index (BMI) 40.0 and over, adult: Secondary | ICD-10-CM | POA: Diagnosis not present

## 2023-02-10 DIAGNOSIS — R000 Tachycardia, unspecified: Secondary | ICD-10-CM | POA: Diagnosis not present

## 2023-02-10 NOTE — Progress Notes (Signed)
[facility] [place], [place] [address]  Internal MEDICINE  Office Visit Note  Patient Name: Austin Stevenson  [id number]  [id number]  Date of Service: 02/10/2023  Chief Complaint  Patient presents with   Follow-up   Hypertension    HPI Pt is here for routine follow up -has been taking his medication daily which is a big improvement. He is setting an alarm reminder. He has only been taking 1 tab of each though and HR remains elevated today. He does state he just came from work as well and this may have elevated HR some more than usual -will increase to 1.5tab ('75mg'$ ) metoprolol daily to see if this helps -snoring, but no gasping/startling awake, wakes refreshed. Feels he is sleeping well and is not interested in sleep study at this time. -He is down 10lbs since last visit and will continue to work on this   Current Medication: Outpatient Encounter Medications as of 02/10/2023  Medication Sig   lisinopril (ZESTRIL) 20 MG tablet Take 1 tablet (20 mg total) by mouth daily.   metoprolol succinate (TOPROL-XL) 50 MG 24 hr tablet Take 1 tablet (50 mg total) by mouth daily. Take with or immediately following a meal.   No facility-administered encounter medications on file as of 02/10/2023.    Surgical History: Past Surgical History:  Procedure Laterality Date   ABDOMINAL SURGERY     ABSCESS DRAINAGE  11/15/2005   creation of cathetrizable stoma     cystopic catheter placement   11/0/2010   CYSTOURETHROSCOPY     dilation of stricture  09/12/2008   EPISPADIUS CORRECTION     ESOPHAGOGASTRODUODENOSCOPY (EGD) WITH PROPOFOL N/A 11/25/2016   Procedure: ESOPHAGOGASTRODUODENOSCOPY (EGD) WITH PROPOFOL;  Surgeon: Lucilla Lame, MD;  Location: [facility] ENDOSCOPY;  Service: Endoscopy;  Laterality: N/A;   MITROFANOFF PROCEDURE  09/10/2005   SUPRAPUBIC CATHETER PLACEMENT  09/28/2005   TRANSRECTAL DRAINAGE OF PELVIC ABSCESS     URETHRAL DILATION      Medical History: Past  Medical History:  Diagnosis Date   Atrophic kidney    Chronic kidney disease    stage 1   Hypertension    Obesity    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    Urethral fistula     Family History: Family History  Problem Relation Age of Onset   Diabetes Mother    Hypertension Paternal Grandfather     Social History   Socioeconomic History   Marital status: Single    Spouse name: Not on file   Number of children: Not on file   Years of education: Not on file   Highest education level: Not on file  Occupational History   Not on file  Tobacco Use   Smoking status: Every Day    Types: E-cigarettes   Smokeless tobacco: Never   Tobacco comments:    "Several times a day"  Vaping Use   Vaping Use: Every day   Devices: e cigarettes   Substance and Sexual Activity   Alcohol use: No   Drug use: No   Sexual activity: Yes  Other Topics Concern   Not on file  Social History Narrative   Not on file   Social Determinants of Health   Financial Resource Strain: Not on file  Food Insecurity: Not on file  Transportation Needs: Not on file  Physical Activity: Not on file  Stress: Not on file  Social Connections: Not on file  Intimate Partner Violence: Not on file  Review of Systems  Constitutional:  Negative for chills, fatigue and unexpected weight change.  HENT:  Negative for congestion, postnasal drip, rhinorrhea, sneezing and sore throat.   Eyes:  Negative for redness.  Respiratory:  Negative for cough, chest tightness and shortness of breath.   Cardiovascular:  Negative for chest pain and palpitations.  Gastrointestinal:  Negative for abdominal pain, constipation, diarrhea, nausea and vomiting.  Genitourinary:  Negative for dysuria and frequency.  Musculoskeletal:  Negative for arthralgias, back pain, joint swelling and neck pain.  Skin:  Negative for rash.  Neurological: Negative.  Negative for tremors and numbness.  Hematological:  Negative for adenopathy.  Does not bruise/bleed easily.  Psychiatric/Behavioral:  Negative for behavioral problems (Depression), sleep disturbance and suicidal ideas. The patient is not nervous/anxious.     Vital Signs: BP 118/75   Pulse (!) 122   Temp 98.3 F (36.8 C)   Resp 16   Ht 6' (1.829 m)   Wt (!) 318 lb 3.2 oz (144.3 kg)   SpO2 99%   BMI 43.16 kg/m    Physical Exam Vitals and nursing note reviewed.  Constitutional:      General: He is not in acute distress.    Appearance: Normal appearance. He is well-developed. He is obese. He is not diaphoretic.  HENT:     Head: Normocephalic and atraumatic.     Mouth/Throat:     Pharynx: No oropharyngeal exudate.  Eyes:     Pupils: Pupils are equal, round, and reactive to light.  Neck:     Thyroid: No thyromegaly.     Vascular: No JVD.     Trachea: No tracheal deviation.  Cardiovascular:     Rate and Rhythm: Regular rhythm. Tachycardia present.     Heart sounds: Normal heart sounds. No murmur heard.    No friction rub. No gallop.  Pulmonary:     Effort: Pulmonary effort is normal. No respiratory distress.     Breath sounds: No wheezing or rales.  Chest:     Chest wall: No tenderness.  Abdominal:     Palpations: Abdomen is soft.  Musculoskeletal:        General: Normal range of motion.     Cervical back: Normal range of motion and neck supple.  Lymphadenopathy:     Cervical: No cervical adenopathy.  Skin:    General: Skin is warm and dry.  Neurological:     Mental Status: He is alert and oriented to person, place, and time.     Cranial Nerves: No cranial nerve deficit.  Psychiatric:        Behavior: Behavior normal.        Thought Content: Thought content normal.        Judgment: Judgment normal.        Assessment/Plan: 1. Hypertension secondary to other renal disorders BP greatly improved and will continue lisinopril and increase metoprolol to 1.5tabs daily for HR  2. Tachycardia Will increase metoprolol to '75mg'$  daily  3.  Morbid obesity with BMI of 40.0-44.9, adult ([place]) Down 10lbs since last visit and will continue to work on diet and exercise   General Counseling: meiko marohn understanding of the findings of todays visit and agrees with plan of treatment. I have discussed any further diagnostic evaluation that may be needed or ordered today. We also reviewed his medications today. he has been encouraged to call the office with any questions or concerns that should arise related to todays visit.    No orders of  the defined types were placed in this encounter.   No orders of the defined types were placed in this encounter.   This patient was seen by Drema Dallas, PA-C in collaboration with Dr. Clayborn Bigness as a part of collaborative care agreement.   Total time spent:30 Minutes Time spent includes review of chart, medications, test results, and follow up plan with the patient.      Dr Lavera Guise Internal medicine

## 2023-02-24 ENCOUNTER — Inpatient Hospital Stay
Admission: EM | Admit: 2023-02-24 | Discharge: 2023-02-26 | DRG: 698 | Disposition: A | Discharge status: Home / Self Care | Payer: Medicaid Other | Attending: Internal Medicine | Admitting: Internal Medicine

## 2023-02-24 ENCOUNTER — Emergency Department: Payer: Medicaid Other

## 2023-02-24 ENCOUNTER — Other Ambulatory Visit: Payer: Self-pay

## 2023-02-24 ENCOUNTER — Encounter: Payer: Self-pay | Admitting: Emergency Medicine

## 2023-02-24 DIAGNOSIS — N10 Acute tubulo-interstitial nephritis: Secondary | ICD-10-CM | POA: Diagnosis present

## 2023-02-24 DIAGNOSIS — N182 Chronic kidney disease, stage 2 (mild): Secondary | ICD-10-CM | POA: Diagnosis present

## 2023-02-24 DIAGNOSIS — Z833 Family history of diabetes mellitus: Secondary | ICD-10-CM

## 2023-02-24 DIAGNOSIS — N200 Calculus of kidney: Secondary | ICD-10-CM

## 2023-02-24 DIAGNOSIS — E66813 Obesity, class 3: Secondary | ICD-10-CM | POA: Insufficient documentation

## 2023-02-24 DIAGNOSIS — Z8249 Family history of ischemic heart disease and other diseases of the circulatory system: Secondary | ICD-10-CM | POA: Diagnosis not present

## 2023-02-24 DIAGNOSIS — N179 Acute kidney failure, unspecified: Secondary | ICD-10-CM | POA: Diagnosis present

## 2023-02-24 DIAGNOSIS — N136 Pyonephrosis: Secondary | ICD-10-CM | POA: Diagnosis present

## 2023-02-24 DIAGNOSIS — N390 Urinary tract infection, site not specified: Secondary | ICD-10-CM

## 2023-02-24 DIAGNOSIS — A419 Sepsis, unspecified organism: Principal | ICD-10-CM | POA: Diagnosis present

## 2023-02-24 DIAGNOSIS — R6520 Severe sepsis without septic shock: Secondary | ICD-10-CM | POA: Diagnosis not present

## 2023-02-24 DIAGNOSIS — I129 Hypertensive chronic kidney disease with stage 1 through stage 4 chronic kidney disease, or unspecified chronic kidney disease: Secondary | ICD-10-CM | POA: Diagnosis present

## 2023-02-24 DIAGNOSIS — M549 Dorsalgia, unspecified: Secondary | ICD-10-CM | POA: Diagnosis present

## 2023-02-24 DIAGNOSIS — A4151 Sepsis due to Escherichia coli [E. coli]: Secondary | ICD-10-CM | POA: Diagnosis present

## 2023-02-24 DIAGNOSIS — T83510A Infection and inflammatory reaction due to cystostomy catheter, initial encounter: Principal | ICD-10-CM | POA: Diagnosis present

## 2023-02-24 DIAGNOSIS — Z1152 Encounter for screening for COVID-19: Secondary | ICD-10-CM | POA: Diagnosis not present

## 2023-02-24 DIAGNOSIS — Y838 Other surgical procedures as the cause of abnormal reaction of the patient, or of later complication, without mention of misadventure at the time of the procedure: Secondary | ICD-10-CM | POA: Diagnosis present

## 2023-02-24 DIAGNOSIS — Z79899 Other long term (current) drug therapy: Secondary | ICD-10-CM

## 2023-02-24 DIAGNOSIS — F17290 Nicotine dependence, other tobacco product, uncomplicated: Secondary | ICD-10-CM | POA: Diagnosis present

## 2023-02-24 DIAGNOSIS — N170 Acute kidney failure with tubular necrosis: Secondary | ICD-10-CM | POA: Diagnosis not present

## 2023-02-24 DIAGNOSIS — Z9889 Other specified postprocedural states: Secondary | ICD-10-CM | POA: Diagnosis not present

## 2023-02-24 DIAGNOSIS — Z6841 Body Mass Index (BMI) 40.0 and over, adult: Secondary | ICD-10-CM | POA: Diagnosis not present

## 2023-02-24 DIAGNOSIS — Z1623 Resistance to quinolones and fluoroquinolones: Secondary | ICD-10-CM | POA: Diagnosis present

## 2023-02-24 DIAGNOSIS — I10 Essential (primary) hypertension: Secondary | ICD-10-CM | POA: Diagnosis not present

## 2023-02-24 DIAGNOSIS — I159 Secondary hypertension, unspecified: Secondary | ICD-10-CM | POA: Diagnosis not present

## 2023-02-24 LAB — COMPREHENSIVE METABOLIC PANEL
ALT: 17 U/L (ref 0–44)
AST: 18 U/L (ref 15–41)
Albumin: 3.9 g/dL (ref 3.5–5.0)
Alkaline Phosphatase: 65 U/L (ref 38–126)
Anion gap: 10 (ref 5–15)
BUN: 28 mg/dL — ABNORMAL HIGH (ref 6–20)
CO2: 22 mmol/L (ref 22–32)
Calcium: 9.3 mg/dL (ref 8.9–10.3)
Chloride: 101 mmol/L (ref 98–111)
Creatinine, Ser: 2.84 mg/dL — ABNORMAL HIGH (ref 0.61–1.24)
GFR, Estimated: 31 mL/min — ABNORMAL LOW (ref >60)
Glucose, Bld: 115 mg/dL — ABNORMAL HIGH (ref 70–99)
Potassium: 4 mmol/L (ref 3.5–5.1)
Sodium: 133 mmol/L — ABNORMAL LOW (ref 135–145)
Total Bilirubin: 2.2 mg/dL — ABNORMAL HIGH (ref 0.3–1.2)
Total Protein: 8.7 g/dL — ABNORMAL HIGH (ref 6.5–8.1)

## 2023-02-24 LAB — RESP PANEL BY RT-PCR (RSV, FLU A&B, COVID)  RVPGX2
Influenza A by PCR: NEGATIVE
Influenza B by PCR: NEGATIVE
Resp Syncytial Virus by PCR: NEGATIVE
SARS Coronavirus 2 by RT PCR: NEGATIVE

## 2023-02-24 LAB — LACTIC ACID, PLASMA
Lactic Acid, Venous: 1.1 mmol/L (ref 0.5–1.9)
Lactic Acid, Venous: 1.1 mmol/L (ref 0.5–1.9)

## 2023-02-24 LAB — URINALYSIS, ROUTINE W REFLEX MICROSCOPIC
Bilirubin Urine: NEGATIVE
Glucose, UA: NEGATIVE mg/dL
Ketones, ur: NEGATIVE mg/dL
Nitrite: POSITIVE — AB
Protein, ur: >=300 mg/dL — AB
Specific Gravity, Urine: 1.012 (ref 1.005–1.030)
Squamous Epithelial / HPF: NONE SEEN /HPF (ref 0–5)
WBC, UA: >50 WBC/hpf (ref 0–5)
pH: 6 (ref 5.0–8.0)

## 2023-02-24 LAB — CBC
HCT: 44.3 % (ref 39.0–52.0)
Hemoglobin: 14.4 g/dL (ref 13.0–17.0)
MCH: 27.7 pg (ref 26.0–34.0)
MCHC: 32.5 g/dL (ref 30.0–36.0)
MCV: 85.4 fL (ref 80.0–100.0)
Platelets: 241 10*3/uL (ref 150–400)
RBC: 5.19 MIL/uL (ref 4.22–5.81)
RDW: 13.2 % (ref 11.5–15.5)
WBC: 16.2 10*3/uL — ABNORMAL HIGH (ref 4.0–10.5)
nRBC: 0 % (ref 0.0–0.2)

## 2023-02-24 LAB — PROCALCITONIN: Procalcitonin: 0.57 ng/mL

## 2023-02-24 LAB — HIV ANTIBODY (ROUTINE TESTING W REFLEX): HIV Screen 4th Generation wRfx: NONREACTIVE

## 2023-02-24 MED ORDER — SODIUM CHLORIDE 0.9 % IV SOLN: INTRAVENOUS | Status: DC

## 2023-02-24 MED ORDER — SODIUM CHLORIDE 0.9 % IV BOLUS
1000.0000 mL | Freq: Once | INTRAVENOUS | Status: AC
  Administered 2023-02-24: 1000 mL via INTRAVENOUS

## 2023-02-24 MED ORDER — ENOXAPARIN SODIUM 40 MG/0.4ML IJ SOSY
40.0000 mg | PREFILLED_SYRINGE | INTRAMUSCULAR | Status: DC
  Administered 2023-02-24 – 2023-02-25 (×2): 40 mg via SUBCUTANEOUS
  Filled 2023-02-24 (×2): qty 0.4

## 2023-02-24 MED ORDER — HYDRALAZINE HCL 20 MG/ML IJ SOLN: 5.0000 mg | INTRAMUSCULAR | Status: DC | PRN

## 2023-02-24 MED ORDER — PIPERACILLIN-TAZOBACTAM 3.375 G IVPB 30 MIN
3.3750 g | Freq: Once | INTRAVENOUS | Status: AC
  Administered 2023-02-24: 3.375 g via INTRAVENOUS
  Filled 2023-02-24: qty 50

## 2023-02-24 MED ORDER — SODIUM CHLORIDE 0.9 % IV BOLUS
1500.0000 mL | Freq: Once | INTRAVENOUS | Status: AC
  Administered 2023-02-24: 1500 mL via INTRAVENOUS

## 2023-02-24 MED ORDER — OXYCODONE-ACETAMINOPHEN 5-325 MG PO TABS
1.0000 | ORAL_TABLET | ORAL | Status: DC | PRN
  Administered 2023-02-24 – 2023-02-25 (×5): 1 via ORAL
  Filled 2023-02-24 (×5): qty 1

## 2023-02-24 MED ORDER — ACETAMINOPHEN 325 MG PO TABS
650.0000 mg | ORAL_TABLET | Freq: Four times a day (QID) | ORAL | Status: DC | PRN
  Administered 2023-02-24: 650 mg via ORAL
  Filled 2023-02-24: qty 2

## 2023-02-24 MED ORDER — ONDANSETRON HCL 4 MG/2ML IJ SOLN: 4.0000 mg | Freq: Three times a day (TID) | INTRAMUSCULAR | Status: DC | PRN

## 2023-02-24 NOTE — ED Provider Notes (Addendum)
[facility] Provider Note    Event Date/Time   First MD Initiated Contact with Patient 02/24/23 [phone]     (approximate)   History   Back Pain   HPI  Austin Stevenson is a 27 y.o. male with history of recent UTI within the last month which she was treated with Cipro, CKD, has 2% functional 1 kidney and 98% of the other.  Complaining of low back pain, fever, chills and bodyaches.  Patient's symptoms have been ongoing for 2 days.  Patient is very concerned that his kidney is infected again.  History of pyelonephritis.      Physical Exam   Triage Vital Signs: ED Triage Vitals  Enc Vitals Group     BP 02/24/23 0923 (!) 94/49     Pulse Rate 02/24/23 0923 (!) 135     Resp 02/24/23 0923 18     Temp 02/24/23 0923 98.1 F (36.7 C)     Temp Source 02/24/23 0923 Oral     SpO2 02/24/23 0923 98 %     Weight 02/24/23 0907 (!) 317 lb 7.4 oz (144 kg)     Height 02/24/23 0907 6' (1.829 m)     Head Circumference --      Peak Flow --      Pain Score --      Pain Loc --      Pain Edu? --      Excl. in [place]? --     Most recent vital signs: Vitals:   02/24/23 0923 02/24/23 1211  BP: (!) 94/49 112/60  Pulse: (!) 135 (!) 117  Resp: 18 15  Temp: 98.1 F (36.7 C) 98.1 F (36.7 C)  SpO2: 98% 98%     General: Awake, no distress.   CV:  Good peripheral perfusion.  Tachycardic, regular rhythm  resp:  Normal effort. Lungs CTA Abd:  No distention.  Tender in left lower quadrant Other:      ED Results / Procedures / Treatments   Labs (all labs ordered are listed, but only abnormal results are displayed) Labs Reviewed  COMPREHENSIVE METABOLIC PANEL - Abnormal; Notable for the following components:      Result Value   Sodium 133 (*)    Glucose, Bld 115 (*)    BUN 28 (*)    Creatinine, Ser 2.84 (*)    Total Protein 8.7 (*)    Total Bilirubin 2.2 (*)    GFR, Estimated 31 (*)    All other components within normal limits  CBC - Abnormal; Notable for the  following components:   WBC 16.2 (*)    All other components within normal limits  URINALYSIS, ROUTINE W REFLEX MICROSCOPIC - Abnormal; Notable for the following components:   Color, Urine YELLOW (*)    APPearance TURBID (*)    Hgb urine dipstick [place] (*)    Protein, ur >=300 (*)    Nitrite POSITIVE (*)    Leukocytes,Ua LARGE (*)    Bacteria, UA MANY (*)    All other components within normal limits  RESP PANEL BY RT-PCR (RSV, FLU A&B, COVID)  RVPGX2  CULTURE, BLOOD (ROUTINE X 2)  CULTURE, BLOOD (ROUTINE X 2)  URINE CULTURE  LACTIC ACID, PLASMA  LACTIC ACID, PLASMA     EKG  EKG   RADIOLOGY X-ray, CT abdomen/pelvis    PROCEDURES:   .Critical Care E&M  Performed by: Versie Starks, PA-C Critical care provider statement:    Critical care time (minutes):  74   Critical care time was exclusive of:  Separately billable procedures and treating other patients   Critical care was necessary to treat or prevent imminent or life-threatening deterioration of the following conditions:  Sepsis   Critical care was time spent personally by me on the following activities:  Blood draw for specimens, development of treatment plan with patient or surrogate, discussions with consultants, examination of patient, evaluation of patient's response to treatment, obtaining history from patient or surrogate, ordering and performing treatments and interventions, ordering and review of laboratory studies, ordering and review of radiographic studies and review of old charts   Care discussed with: admitting provider   After initial E/M assessment, critical care services were subsequently performed that were exclusive of separately billable procedures or treatment.      MEDICATIONS ORDERED IN ED: Medications  sodium chloride 0.9 % bolus 1,000 mL (0 mLs Intravenous Stopped 02/24/23 1246)  piperacillin-tazobactam (ZOSYN) IVPB 3.375 g (0 g Intravenous Stopped 02/24/23 1246)  sodium chloride 0.9 % bolus  1,000 mL (1,000 mLs Intravenous New Bag/Given 02/24/23 1246)     IMPRESSION / MDM / ASSESSMENT AND PLAN / ED COURSE  I reviewed the triage vital signs and the nursing notes.                              Differential diagnosis includes, but is not limited to, sepsis, pyelonephritis, COVID, influenza, UTI  Patient's presentation is most consistent with acute presentation with potential threat to life or bodily function.   Due to the patient's tachycardia along with elevated temp at home and low blood pressure I feel that we will start giving him fluids right away, concerns for sepsis or pyelonephritis.  Urine looks very cloudy  Labs and imaging ordered  EKG shows sinus tachycardia at 118 bpm, see physician read  WBC is concerning for infection at 16.8, this combined with the patient's tachycardia and fever concerns me for sepsis  Urinalysis shows large amount of infection with positive nitrates, large amount of leuks, greater than 50 WBCs, many bacteria Comprehensive metabolic panel has decreased sodium elevated glucose his BUN and creatinine are elevated with a GFR of 31, total bili is at 2.2.  Have concerns of running contrast due to the patient's CKD. Order CT renal stone to assess for pyelonephritis versus infected kidney stone  In review of the patient's past medical charts, he was recently given Omnicef on 01/30/2023 for 14 days.  Has history of suprapubic catheter due to trauma  Respiratory panel is reassuring, first lactic acid is normal  CT abdomen/pelvis shows several stones along with ascending UTI.  The radiologist report was reviewed and interpreted as by me.  Discussed with Dr. Quentin Cornwall, spoke with Dr. Delaine Lame, she recommends Zosyn if admitting, ampicillin and outpatient amoxicillin if being discharged  Even after 1 L of fluids patient's heart rate is still 117, feel that he is septic even though his lactic acid is normal.  Will start Zosyn IV, second liter of fluids IV,  consult hospitalist for admission  Dr Blaine Hamper admitting, pt is hemodynamically stable at this time.  I did explain that he meets sepsis criteria even though his lactic acid was normal.  Also informed him I had discussed this with Dr. Delaine Lame   FINAL CLINICAL IMPRESSION(S) / ED DIAGNOSES   Final diagnoses:  Acute sepsis ([place])  Urinary tract infection without hematuria, site unspecified  Kidney stone     Rx /  DC Orders   ED Discharge Orders     None        Note:  This document was prepared using Dragon voice recognition software and may include unintentional dictation errors.    Versie Starks, PA-C 02/24/23 1248    Merlyn Lot, MD 02/24/23 1258    Versie Starks, PA-C 02/24/23 1304    Merlyn Lot, MD 02/24/23 [phone]

## 2023-02-24 NOTE — H&P (Signed)
History and Physical    IDREES ORLANDINI [id number] DOB: [birth year] DOA: 02/24/2023  Referring MD/NP/PA:   PCP: Mylinda Latina, PA-C   Patient coming from:  The patient is coming from home.   Chief Complaint: Lower back pain  HPI: Austin Stevenson is a 27 y.o. male with medical history significant of posttraumatic urethral stricture, s/p of suprapubic catheter placement, hypertension, CKD-II, obesity with BMI 43.06, smoke e-cigarette, who presents with lower back pain.  He states that he started having lower back pain since yesterday, which involves both sides of her lower back, constant, aching, 6 out of 10 in severity, nonradiating.  Patient does not have fever or chills.  No chest pain, cough, short of breath.  No nausea vomiting, diarrhea or abdominal pain.  He states that his urine is cloudy, but no hematuria.  Patient states that he changed his suprapubic catheter 2 days ago. Pt states the he has 2% functional 1 kidney and 98% of the other.    Data reviewed independently and ED Course: pt was found to have WBC 16.2, worsening renal function with creatinine 2.84, BUN 28, GFR 31 (recent baseline creatinine 1.50 on 07/25/2022), positive urinalysis (turbid appearance, large amount of leukocyte, positive nitrite, many bacteria, WBC> 50).  Temperature normal, blood pressure 94/49, 112/60, heart rate 135, RR 17, RR 18, oxygen saturation 98% on room air.  Chest x-ray showed low volume without infiltration.  Patient is admitted to [facility] bed as inpatient.  ED physician consulted Dr. Delaine Lame of ID  CT scan of renal stone    1. There is left-sided pelvocaliectasis and mild left hydroureter. No ureteral calculi are identified. Findings may reflect sequelae of recently passed kidney stone or ascending urinary tract infection. 2. Suprapubic bladder catheter is in place. Multiple stones are identified within the dependent portion of the bladder which measures up to 5 mm. 3. Irregular and  asymmetric areas of bladder wall thickening are noted involving the right anterior bladder wall and left lateral bladder wall. Given patient's age, findings are favored to reflect sequelae of chronic inflammation/infection and/or chronic suprapubic catheter. 4. Asymmetric right renal atrophy. 5. Splenomegaly. 6. Prominent left retroperitoneal and left external iliac lymph nodes are nonspecific but may be reactive in the setting of underlying infection.    EKG: I have personally reviewed.  Sinus rhythm, QTc 440, early R wave progression, low voltage.   Review of Systems:   General: no fevers, chills, no body weight gain, has poor appetite, has fatigue HEENT: no blurry vision, hearing changes or sore throat Respiratory: no dyspnea, coughing, wheezing CV: no chest pain, no palpitations GI: no nausea, vomiting, abdominal pain, diarrhea, constipation.  GU: no dysuria, burning on urination, increased urinary frequency, hematuria  Has a suprapubic catheter Ext: no leg edema Neuro: no unilateral weakness, numbness, or tingling, no vision change or hearing loss Skin: no rash, no skin tear. MSK: Has lower back pain Heme: No easy bruising.  Travel history: No recent long distant travel.   Allergy: No Known Allergies  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    Urethral fistula     Past Surgical History:  Procedure Laterality Date   ABDOMINAL SURGERY     ABSCESS DRAINAGE  11/15/2005   creation of cathetrizable stoma     cystopic catheter placement   11/0/2010  CYSTOURETHROSCOPY     dilation of stricture  09/12/2008   EPISPADIUS CORRECTION     ESOPHAGOGASTRODUODENOSCOPY (EGD) WITH PROPOFOL N/A 11/25/2016   Procedure: ESOPHAGOGASTRODUODENOSCOPY (EGD) WITH PROPOFOL;  Surgeon: Lucilla Lame, MD;  Location: [facility] ENDOSCOPY;  Service: Endoscopy;  Laterality: N/A;    MITROFANOFF PROCEDURE  09/10/2005   SUPRAPUBIC CATHETER PLACEMENT  09/28/2005   TRANSRECTAL DRAINAGE OF PELVIC ABSCESS     URETHRAL DILATION      Social History:  reports that he has been smoking e-cigarettes. He has never used smokeless tobacco. He reports that he does not drink alcohol and does not use drugs.  Family History:  Family History  Problem Relation Age of Onset   Diabetes Mother    Hypertension Paternal Grandfather      Prior to Admission medications   Medication Sig Start Date End Date Taking? Authorizing Provider  lisinopril (ZESTRIL) 20 MG tablet Take 1 tablet (20 mg total) by mouth daily. 06/24/22   McDonough, Si Gaul, PA-C  metoprolol succinate (TOPROL-XL) 50 MG 24 hr tablet Take 1 tablet (50 mg total) by mouth daily. Take with or immediately following a meal. 06/24/22   Mylinda Latina, PA-C    Physical Exam: Vitals:   02/24/23 [id number] 02/24/23 0923 02/24/23 1211 02/24/23 1802  BP:  (!) 94/49 112/60 106/61  Pulse:  (!) 135 (!) 117 (!) 117  Resp:  '18 15 10  '$ Temp:  98.1 F (36.7 C) 98.1 F (36.7 C) 100.3 F (37.9 C)  TempSrc:  Oral Oral Oral  SpO2:  98% 98% 97%  Weight: (!) 144 kg     Height: 6' (1.829 m)      General: Not in acute distress HEENT:       Eyes: PERRL, EOMI, no scleral icterus.       ENT: No discharge from the ears and nose, no pharynx injection, no tonsillar enlargement.        Neck: No JVD, no bruit, no mass felt. Heme: No neck lymph node enlargement. Cardiac: S1/S2, RRR, No murmurs, No gallops or rubs. Respiratory: No rales, wheezing, rhonchi or rubs. GI: Soft, nondistended, nontender, no rebound pain, no organomegaly, BS present.  Has suprapubic catheter in place GU: Has CVA tenderness bilaterally Ext: No pitting leg edema bilaterally. 1+DP/PT pulse bilaterally. Musculoskeletal: No joint deformities, No joint redness or warmth, no limitation of ROM in spin. Skin: No rashes.  Neuro: Alert, oriented X3, cranial nerves II-XII grossly  intact, moves all extremities normally.  Psych: Patient is not psychotic, no suicidal or hemocidal ideation.  Labs on Admission: I have personally reviewed following labs and imaging studies  CBC: Recent Labs  Lab 02/24/23 0936  WBC 16.2*  HGB 14.4  HCT 44.3  MCV 85.4  PLT [id number]   Basic Metabolic Panel: Recent Labs  Lab 02/24/23 0936  NA 133*  K 4.0  CL 101  CO2 22  GLUCOSE 115*  BUN 28*  CREATININE 2.84*  CALCIUM 9.3   GFR: Estimated Creatinine Clearance: 59.1 mL/min (A) (by C-G formula based on SCr of 2.84 mg/dL (H)). Liver Function Tests: Recent Labs  Lab 02/24/23 0936  AST 18  ALT 17  ALKPHOS 65  BILITOT 2.2*  PROT 8.7*  ALBUMIN 3.9   No results for input(s): "LIPASE", "AMYLASE" in the last 168 hours. No results for input(s): "AMMONIA" in the last 168 hours. Coagulation Profile: No results for input(s): "INR", "PROTIME" in the last 168 hours. Cardiac Enzymes: No results for input(s): "CKTOTAL", "CKMB", "CKMBINDEX", "TROPONINI" in  the last 168 hours. BNP (last 3 results) No results for input(s): "PROBNP" in the last 8760 hours. HbA1C: No results for input(s): "HGBA1C" in the last 72 hours. CBG: No results for input(s): "GLUCAP" in the last 168 hours. Lipid Profile: No results for input(s): "CHOL", "HDL", "LDLCALC", "TRIG", "CHOLHDL", "LDLDIRECT" in the last 72 hours. Thyroid Function Tests: No results for input(s): "TSH", "T4TOTAL", "FREET4", "T3FREE", "THYROIDAB" in the last 72 hours. Anemia Panel: No results for input(s): "VITAMINB12", "FOLATE", "FERRITIN", "TIBC", "IRON", "RETICCTPCT" in the last 72 hours. Urine analysis:    Component Value Date/Time   COLORURINE YELLOW (A) 02/24/2023 0936   APPEARANCEUR TURBID (A) 02/24/2023 0936   APPEARANCEUR Cloudy (A) 01/07/2022 0714   LABSPEC 1.012 02/24/2023 0936   PHURINE 6.0 02/24/2023 0936   GLUCOSEU NEGATIVE 02/24/2023 0936   HGBUR Frie (A) 02/24/2023 0936   BILIRUBINUR NEGATIVE 02/24/2023 0936    BILIRUBINUR Requena 07/08/2022 1535   BILIRUBINUR Negative 01/07/2022 0714   KETONESUR NEGATIVE 02/24/2023 0936   PROTEINUR >=300 (A) 02/24/2023 0936   UROBILINOGEN 0.2 07/08/2022 1535   NITRITE POSITIVE (A) 02/24/2023 0936   LEUKOCYTESUR LARGE (A) 02/24/2023 0936   Sepsis Labs: '@LABRCNTIP'$ (procalcitonin:4,lacticidven:4) ) Recent Results (from the past 240 hour(s))  Resp panel by RT-PCR (RSV, Flu A&B, Covid) Urine, Clean Catch     Status: None   Collection Time: 02/24/23  9:54 AM   Specimen: Urine, Clean Catch; Nasal Swab  Result Value Ref Range Status   SARS Coronavirus 2 by RT PCR NEGATIVE NEGATIVE Final    Comment: (NOTE) SARS-CoV-2 target nucleic acids are NOT DETECTED.  The SARS-CoV-2 RNA is generally detectable in upper respiratory specimens during the acute phase of infection. The lowest concentration of SARS-CoV-2 viral copies this assay can detect is 138 copies/mL. A negative result does not preclude SARS-Cov-2 infection and should not be used as the sole basis for treatment or other patient management decisions. A negative result may occur with  improper specimen collection/handling, submission of specimen other than nasopharyngeal swab, presence of viral mutation(s) within the areas targeted by this assay, and inadequate number of viral copies(<138 copies/mL). A negative result must be combined with clinical observations, patient history, and epidemiological information. The expected result is Negative.  Fact Sheet for Patients:  EntrepreneurPulse.com.au  Fact Sheet for Healthcare Providers:  IncredibleEmployment.be  This test is no t yet approved or cleared by the [place] FDA and  has been authorized for detection and/or diagnosis of SARS-CoV-2 by FDA under an Emergency Use Authorization (EUA). This EUA will remain  in effect (meaning this test can be used) for the duration of the COVID-19 declaration under Section 564(b)(1)  of the Act, 21 U.S.C.section 360bbb-3(b)(1), unless the authorization is terminated  or revoked sooner.       Influenza A by PCR NEGATIVE NEGATIVE Final   Influenza B by PCR NEGATIVE NEGATIVE Final    Comment: (NOTE) The Xpert Xpress SARS-CoV-2/FLU/RSV plus assay is intended as an aid in the diagnosis of influenza from Nasopharyngeal swab specimens and should not be used as a sole basis for treatment. Nasal washings and aspirates are unacceptable for Xpert Xpress SARS-CoV-2/FLU/RSV testing.  Fact Sheet for Patients: EntrepreneurPulse.com.au  Fact Sheet for Healthcare Providers: IncredibleEmployment.be  This test is not yet approved or cleared by the [place] FDA and has been authorized for detection and/or diagnosis of SARS-CoV-2 by FDA under an Emergency Use Authorization (EUA). This EUA will remain in effect (meaning this test can be used) for the duration of  the COVID-19 declaration under Section 564(b)(1) of the Act, 21 U.S.C. section 360bbb-3(b)(1), unless the authorization is terminated or revoked.     Resp Syncytial Virus by PCR NEGATIVE NEGATIVE Final    Comment: (NOTE) Fact Sheet for Patients: EntrepreneurPulse.com.au  Fact Sheet for Healthcare Providers: IncredibleEmployment.be  This test is not yet approved or cleared by the [place] FDA and has been authorized for detection and/or diagnosis of SARS-CoV-2 by FDA under an Emergency Use Authorization (EUA). This EUA will remain in effect (meaning this test can be used) for the duration of the COVID-19 declaration under Section 564(b)(1) of the Act, 21 U.S.C. section 360bbb-3(b)(1), unless the authorization is terminated or revoked.  Performed at [facility], [address] [place], [place] [address]      Radiological Exams on Admission: CT Renal Stone Study  Result Date: 02/24/2023 CLINICAL DATA:  Abdominal  pain/flank pain.  Rule out kidney stone. EXAM: CT ABDOMEN AND PELVIS WITHOUT CONTRAST TECHNIQUE: Multidetector CT imaging of the abdomen and pelvis was performed following the standard protocol without IV contrast. RADIATION DOSE REDUCTION: This exam was performed according to the departmental dose-optimization program which includes automated exposure control, adjustment of the mA and/or kV according to patient size and/or use of iterative reconstruction technique. COMPARISON:  10/04/2004 FINDINGS: Lower chest: Calcified granuloma noted in the left lower lobe. No pleural fluid or airspace disease. Hepatobiliary: No focal liver abnormality is seen. No gallstones, gallbladder wall thickening, or biliary dilatation. Pancreas: Unremarkable. No pancreatic ductal dilatation or surrounding inflammatory changes. Spleen: Spleen measures 13.8 cm in length. No focal splenic abnormality. Adrenals/Urinary Tract: Normal adrenal glands. Asymmetric right renal atrophy. Punctate stone within the inferior pole of the right kidney measures 2-3 mm. Gas noted within the right renal collecting system. Mild cortical scarring noted involving the anterior and lateral cortex of the left kidney. Cyst or calyceal diverticulum within the upper pole of the left kidney measures 1.9 cm. No follow-up imaging recommended. There is left-sided pelvocaliectasis and mild left hydroureter. No ureteral calculi. Suprapubic bladder catheter. Multiple stones are identified within the dependent portion of the bladder which measures up to 5 mm, image 83/2. Irregular and asymmetric areas of bladder wall thickening are noted involving the right anterior bladder wall and left lateral bladder wall, image 81/2. Stomach/Bowel: Stomach is normal. The appendix is visualized and is within normal limits. No bowel wall thickening, inflammation, or distension. Vascular/Lymphatic: Normal appearance of the abdominal aorta. Prominent left retroperitoneal lymph nodes measure  up to 1.1 cm, image 35/2.Left external iliac lymph node measures 1 cm, image 79/2. Reproductive: Prostate gland measures 5.4 by 5.6 by 5.4 cm (volume = 86 cm^3). Other: No free fluid or fluid collections.  No pneumoperitoneum. Musculoskeletal: No acute or significant osseous findings. IMPRESSION: 1. There is left-sided pelvocaliectasis and mild left hydroureter. No ureteral calculi are identified. Findings may reflect sequelae of recently passed kidney stone or ascending urinary tract infection. 2. Suprapubic bladder catheter is in place. Multiple stones are identified within the dependent portion of the bladder which measures up to 5 mm. 3. Irregular and asymmetric areas of bladder wall thickening are noted involving the right anterior bladder wall and left lateral bladder wall. Given patient's age, findings are favored to reflect sequelae of chronic inflammation/infection and/or chronic suprapubic catheter. 4. Asymmetric right renal atrophy. 5. Splenomegaly. 6. Prominent left retroperitoneal and left external iliac lymph nodes are nonspecific but may be reactive in the setting of underlying infection. Electronically Signed   By: Lovena Le  Clovis Riley M.D.   On: 02/24/2023 11:34   DG Chest Port 1 View  Result Date: 02/24/2023 CLINICAL DATA:  Questionable sepsis EXAM: PORTABLE CHEST 1 VIEW COMPARISON:  No recent examination available for comparison FINDINGS: The heart size and mediastinal contours are within normal limits. Low lung volumes without evidence of focal consolidation or pleural effusion. The visualized skeletal structures are unremarkable. IMPRESSION: Low lung volumes without evidence of acute cardiopulmonary process. Electronically Signed   By: Keane Police D.O.   On: 02/24/2023 10:44      Assessment/Plan Principal Problem:   Acute pyelonephritis Active Problems:   Sepsis ([place])   Acute renal failure superimposed on stage 2 chronic kidney disease ([place])   History of suprapubic  catheter   Assessment and Plan:  Sepsis due to acute pyelonephritis: Patient has sepsis with WBC 16.2, heart rate up to 135.  Lactic acid is normal 1.1 --> 1.1.  Per ED physician, she discussed with Dr. Delaine Lame ID, who  recommends Zosyn if admitting, ampicillin or amoxicillin if being discharged   -Admit to MedSurg bed as inpatient -IV Zosyn -Follow-up blood culture and urine culture -Check procalcitonin level -IV fluid: 2.5 L normal saline, then 75 cc/h  Acute renal failure superimposed on stage 2 chronic kidney disease ([place]): EDP discussed with Dr. Delaine Lame in ED. She recommends Zosyn if admitting, ampicillin and outpatient amoxicillin if being discharged. Baseline creatinine 1.5 on 07/25/2022.  His creatinine is at 2.84, BUN 28, GFR 31 today.  This is due to pyelonephritis.  CT scan showed mild left hydroureter, but no obstructive stone. -Avoid using renal toxic medications  History of suprapubic catheter -Patient changed catheter 2 days ago.    DVT ppx: SQ Lovenox  Code Status: Full code  Family Communication: not done, no family member is at bed side.       Disposition Plan:  Anticipate discharge back to previous environment  Consults called:  Dr. Delaine Lame of ID  Admission status and Level of care: Med-Surg:  as inpt       Dispo: The patient is from: Home              Anticipated d/c is to: Home              Anticipated d/c date is: 2 days              Patient currently is not medically stable to d/c.    Severity of Illness:  The appropriate patient status for this patient is INPATIENT. Inpatient status is judged to be reasonable and necessary in order to provide the required intensity of service to ensure the patient's safety. The patient's presenting symptoms, physical exam findings, and initial radiographic and laboratory data in the context of their chronic comorbidities is felt to place them at high risk for further clinical deterioration. Furthermore, it  is not anticipated that the patient will be medically stable for discharge from the hospital within 2 midnights of admission.   * I certify that at the point of admission it is my clinical judgment that the patient will require inpatient hospital care spanning beyond 2 midnights from the point of admission due to high intensity of service, high risk for further deterioration and high frequency of surveillance required.*       Date of Service 02/24/2023    Ivor Costa [facility]   If 7PM-7AM, please contact night-coverage [email or web address] 02/24/2023, 6:13 PM

## 2023-02-24 NOTE — ED Triage Notes (Signed)
C/O left lower back pain x 2 days.  Denies injury.  Denies dysuria.  States had CKD and is concerned pain is from kidney.  AAOx3.  Skin warm and dry. NAD

## 2023-02-25 DIAGNOSIS — E66813 Obesity, class 3: Secondary | ICD-10-CM | POA: Insufficient documentation

## 2023-02-25 DIAGNOSIS — I159 Secondary hypertension, unspecified: Secondary | ICD-10-CM

## 2023-02-25 LAB — CBC
HCT: 36.5 % — ABNORMAL LOW (ref 39.0–52.0)
Hemoglobin: 12 g/dL — ABNORMAL LOW (ref 13.0–17.0)
MCH: 27.8 pg (ref 26.0–34.0)
MCHC: 32.9 g/dL (ref 30.0–36.0)
MCV: 84.7 fL (ref 80.0–100.0)
Platelets: 166 10*3/uL (ref 150–400)
RBC: 4.31 MIL/uL (ref 4.22–5.81)
RDW: 13.2 % (ref 11.5–15.5)
WBC: 11.2 10*3/uL — ABNORMAL HIGH (ref 4.0–10.5)
nRBC: 0 % (ref 0.0–0.2)

## 2023-02-25 LAB — BASIC METABOLIC PANEL
Anion gap: 6 (ref 5–15)
BUN: 23 mg/dL — ABNORMAL HIGH (ref 6–20)
CO2: 19 mmol/L — ABNORMAL LOW (ref 22–32)
Calcium: 8.4 mg/dL — ABNORMAL LOW (ref 8.9–10.3)
Chloride: 108 mmol/L (ref 98–111)
Creatinine, Ser: 1.68 mg/dL — ABNORMAL HIGH (ref 0.61–1.24)
GFR, Estimated: 58 mL/min — ABNORMAL LOW (ref >60)
Glucose, Bld: 90 mg/dL (ref 70–99)
Potassium: 3.7 mmol/L (ref 3.5–5.1)
Sodium: 133 mmol/L — ABNORMAL LOW (ref 135–145)

## 2023-02-25 MED ORDER — PIPERACILLIN-TAZOBACTAM 3.375 G IVPB
3.3750 g | Freq: Three times a day (TID) | INTRAVENOUS | Status: DC
  Administered 2023-02-25 – 2023-02-26 (×4): 3.375 g via INTRAVENOUS
  Filled 2023-02-25 (×4): qty 50

## 2023-02-25 NOTE — Hospital Course (Signed)
Taken from H&P.  [name] is a 27 y.o. male with medical history significant of posttraumatic urethral stricture, s/p of suprapubic catheter placement, hypertension, CKD-II, obesity with BMI 43.06, smoke e-cigarette, who presents with lower back pain for 1 day.  No fever or chills. Patient states that he changed his suprapubic catheter 2 days ago. Pt states the he has 2% functional 1 kidney and 98% of the other.   Data reviewed independently and ED Course: pt was found to have WBC 16.2, worsening renal function with creatinine 2.84, BUN 28, GFR 31 (recent baseline creatinine 1.50 on 07/25/2022), positive urinalysis (turbid appearance, large amount of leukocyte, positive nitrite, many bacteria, WBC> 50).  Chest x-ray showed low volume without infiltration.  CT scan of renal stone    1. There is left-sided pelvocaliectasis and mild left hydroureter. No ureteral calculi are identified. Findings may reflect sequelae of recently passed kidney stone or ascending urinary tract infection. 2. Suprapubic bladder catheter is in place. Multiple stones are identified within the dependent portion of the bladder which measures up to 5 mm. 3. Irregular and asymmetric areas of bladder wall thickening are noted involving the right anterior bladder wall and left lateral bladder wall. Given patient's age, findings are favored to reflect sequelae of chronic inflammation/infection and/or chronic suprapubic catheter. 4. Asymmetric right renal atrophy. 5. Splenomegaly. 6. Prominent left retroperitoneal and left external iliac lymph nodes are nonspecific but may be reactive in the setting of underlying infection. EKG: Sinus rhythm, QTc 440, early R wave progression, low voltage.    Per ED physician, she discussed with Dr. Delaine Lame ID, who  recommends Zosyn if admitting, ampicillin or amoxicillin if being discharged.  Patient was started on Zosyn.  3/12: Patient febrile with maximum recorded temperature of  100.3 over the past 24 hours, still mild tachycardia.  Patient met sepsis criteria with leukocytosis and tachycardia, secondary to UTI and pyelonephritis.  Labs with improving leukocytosis but all cell lines decreased.  Procalcitonin at 0.57.  Preliminary blood cultures negative in 24 hours.  Urine cultures pending.  3/13.  E. coli growing out of the urine culture and only resistant to Cipro.  Patient received IV Zosyn while here and will go home on high-dose amoxicillin.

## 2023-02-25 NOTE — Assessment & Plan Note (Signed)
Please see above

## 2023-02-25 NOTE — Plan of Care (Signed)
Patient AOX4, VSS throughout shift.  All meds given on time as ordered.  Pt c/o pain relieved by PRN percocet.  Pt voided in bathroom.  Tele on pt.  POC maintained, will continue to monitor.  Problem: Education: Goal: Knowledge of General Education information will improve Description: Including pain rating scale, medication(s)/side effects and non-pharmacologic comfort measures Outcome: Progressing   Problem: Health Behavior/Discharge Planning: Goal: Ability to manage health-related needs will improve Outcome: Progressing   Problem: Clinical Measurements: Goal: Ability to maintain clinical measurements within normal limits will improve Outcome: Progressing Goal: Will remain free from infection Outcome: Progressing Goal: Diagnostic test results will improve Outcome: Progressing Goal: Respiratory complications will improve Outcome: Progressing Goal: Cardiovascular complication will be avoided Outcome: Progressing   Problem: Activity: Goal: Risk for activity intolerance will decrease Outcome: Progressing   Problem: Nutrition: Goal: Adequate nutrition will be maintained Outcome: Progressing   Problem: Coping: Goal: Level of anxiety will decrease Outcome: Progressing   Problem: Elimination: Goal: Will not experience complications related to bowel motility Outcome: Progressing Goal: Will not experience complications related to urinary retention Outcome: Progressing   Problem: Pain Managment: Goal: General experience of comfort will improve Outcome: Progressing   Problem: Safety: Goal: Ability to remain free from injury will improve Outcome: Progressing   Problem: Skin Integrity: Goal: Risk for impaired skin integrity will decrease Outcome: Progressing

## 2023-02-25 NOTE — Consult Note (Signed)
Pharmacy Antibiotic Note  Austin Stevenson is a 27 y.o. male admitted on 02/24/2023 with acute pyelonephritis.  Pharmacy has been consulted for Zosyn dosing.  Plan: -Start Zosyn 3.375g IV q8h (4 hour infusion). -Follow renal function and cultures for adjustments   Height: 6' (182.9 cm) Weight: (!) 144 kg (317 lb 7.4 oz) IBW/kg (Calculated) : 77.6  Temp (24hrs), Avg:98.8 F (37.1 C), Min:98.1 F (36.7 C), Max:100.3 F (37.9 C)  Recent Labs  Lab 02/24/23 0936 02/24/23 1011 02/24/23 1210 02/25/23 0702  WBC 16.2*  --   --  11.2*  CREATININE 2.84*  --   --   --   LATICACIDVEN  --  1.1 1.1  --     Estimated Creatinine Clearance: 59.1 mL/min (A) (by C-G formula based on SCr of 2.84 mg/dL (H)).    No Known Allergies  Antimicrobials this admission: Zosyn 3/12 >>  Dose adjustments this admission: N/A  Microbiology results: 3/11 BCx: ngtd 3/11 UCx: pending   Thank you for allowing pharmacy to be a part of this patient's care.  Lorin Picket, PharmD 02/25/2023 8:23 AM

## 2023-02-25 NOTE — Assessment & Plan Note (Signed)
Improvement of creatinine to 1.68 with IV fluid.  Baseline seems to be around 1.4-1.5.CT scan showed mild left hydroureter, but no obstructive stone.  -Continue with IV fluid for another day -Monitor renal function -Avoid nephrotoxins

## 2023-02-25 NOTE — Assessment & Plan Note (Signed)
Patient has sepsis with WBC 16.2, heart rate up to 135.  Lactic acid is normal 1.1 --> 1.1.  Per ED physician, she discussed with Dr. Delaine Lame ID, who  recommends Zosyn if admitting, ampicillin or amoxicillin if being discharged   Preliminary blood cultures negative, urine cultures pending.  Procalcitonin at 0.57 -Continue with Zosyn -Follow-up final culture results

## 2023-02-25 NOTE — Progress Notes (Signed)
Progress Note   Patient: Austin Stevenson [id number] DOB: [birth year] DOA: 02/24/2023     1 DOS: the patient was seen and examined on 02/25/2023   Brief hospital course: Taken from H&P.  Austin Stevenson is a 27 y.o. male with medical history significant of posttraumatic urethral stricture, s/p of suprapubic catheter placement, hypertension, CKD-II, obesity with BMI 43.06, smoke e-cigarette, who presents with lower back pain for 1 day.  No fever or chills. Patient states that he changed his suprapubic catheter 2 days ago. Pt states the he has 2% functional 1 kidney and 98% of the other.   Data reviewed independently and ED Course: pt was found to have WBC 16.2, worsening renal function with creatinine 2.84, BUN 28, GFR 31 (recent baseline creatinine 1.50 on 07/25/2022), positive urinalysis (turbid appearance, large amount of leukocyte, positive nitrite, many bacteria, WBC> 50).  Chest x-ray showed low volume without infiltration.  CT scan of renal stone    1. There is left-sided pelvocaliectasis and mild left hydroureter. No ureteral calculi are identified. Findings may reflect sequelae of recently passed kidney stone or ascending urinary tract infection. 2. Suprapubic bladder catheter is in place. Multiple stones are identified within the dependent portion of the bladder which measures up to 5 mm. 3. Irregular and asymmetric areas of bladder wall thickening are noted involving the right anterior bladder wall and left lateral bladder wall. Given patient's age, findings are favored to reflect sequelae of chronic inflammation/infection and/or chronic suprapubic catheter. 4. Asymmetric right renal atrophy. 5. Splenomegaly. 6. Prominent left retroperitoneal and left external iliac lymph nodes are nonspecific but may be reactive in the setting of underlying infection. EKG: Sinus rhythm, QTc 440, early R wave progression, low voltage.    Per ED physician, she discussed with Dr. Delaine Lame ID,  who  recommends Zosyn if admitting, ampicillin or amoxicillin if being discharged.  Patient was started on Zosyn.  3/12: Patient febrile with maximum recorded temperature of 100.3 over the past 24 hours, still mild tachycardia.  Patient met sepsis criteria with leukocytosis and tachycardia, secondary to UTI and pyelonephritis.  Labs with improving leukocytosis but all cell lines decreased.  Procalcitonin at 0.57.  Preliminary blood cultures negative in 24 hours.  Urine cultures pending.   Assessment and Plan: * Acute pyelonephritis Patient has sepsis with WBC 16.2, heart rate up to 135.  Lactic acid is normal 1.1 --> 1.1.  Per ED physician, she discussed with Dr. Delaine Lame ID, who  recommends Zosyn if admitting, ampicillin or amoxicillin if being discharged   Preliminary blood cultures negative, urine cultures pending.  Procalcitonin at 0.57 -Continue with Zosyn -Follow-up final culture results  Sepsis ([place]) -Please see above  Acute renal failure superimposed on stage 2 chronic kidney disease ([place]) Improvement of creatinine to 1.68 with IV fluid.  Baseline seems to be around 1.4-1.5.CT scan showed mild left hydroureter, but no obstructive stone.  -Continue with IV fluid for another day -Monitor renal function -Avoid nephrotoxins  History of suprapubic catheter Patient has suprapubic catheter in place since age 60.  Follow-up with urology at [facility]. -Catheter was changed 2 days ago.  Hypertension Blood pressure on softer side. -Holding home lisinopril and metoprolol. -Continue to monitor  Obesity, Class III, BMI 40-49.9 (morbid obesity) (HCC) Estimated body mass index is 43.06 kg/m as calculated from the following:   Height as of this encounter: 6' (1.829 m).   Weight as of this encounter: 144 kg.   -This will complicate overall prognosis  Subjective: Patient  continued to have left flank pain, stating it is little better than before.  No nausea or vomiting.  Physical  Exam: Vitals:   02/24/23 1932 02/24/23 1954 02/25/23 0505 02/25/23 1005  BP: (!) 99/53 (!) 96/55 115/69 (!) 93/54  Pulse: 97 96 (!) 107 94  Resp: '15 16 16 18  '$ Temp: 98.6 F (37 C) 98.5 F (36.9 C) 99.4 F (37.4 C) 98.3 F (36.8 C)  TempSrc: Oral Oral    SpO2: 98% 98% 99% 100%  Weight:      Height:       General.  Obese young adult, in no acute distress. Pulmonary.  Lungs clear bilaterally, normal respiratory effort. CV.  Regular rate and rhythm, no JVD, rub or murmur. Abdomen.  Soft, left CVA tenderness, nondistended, BS positive.  Suprapubic catheter in place CNS.  Alert and oriented .  No focal neurologic deficit. Extremities.  No edema, no cyanosis, pulses intact and symmetrical. Psychiatry.  Judgment and insight appears normal.   Data Reviewed: Prior data reviewed  Family Communication: Discussed with patient  Disposition: Status is: Inpatient Remains inpatient appropriate because: Severity of illness  Planned Discharge Destination: Home  DVT prophylaxis.  Lovenox Time spent: 45 minutes  This record has been created using Systems analyst. Errors have been sought and corrected,but may not always be located. Such creation errors do not reflect on the standard of care.   Author: Lorella Nimrod, MD 02/25/2023 12:02 PM  For on call review [email or web address].

## 2023-02-25 NOTE — Assessment & Plan Note (Signed)
Blood pressure on softer side. -Holding home lisinopril and metoprolol. -Continue to monitor

## 2023-02-25 NOTE — Assessment & Plan Note (Signed)
Estimated body mass index is 43.06 kg/m as calculated from the following:   Height as of this encounter: 6' (1.829 m).   Weight as of this encounter: 144 kg.   -This will complicate overall prognosis

## 2023-02-25 NOTE — Assessment & Plan Note (Signed)
Patient has suprapubic catheter in place since age 27.  Follow-up with urology at [facility]. -Catheter was changed 2 days ago.

## 2023-02-26 DIAGNOSIS — A4151 Sepsis due to Escherichia coli [E. coli]: Secondary | ICD-10-CM

## 2023-02-26 DIAGNOSIS — R6520 Severe sepsis without septic shock: Secondary | ICD-10-CM

## 2023-02-26 DIAGNOSIS — N170 Acute kidney failure with tubular necrosis: Secondary | ICD-10-CM

## 2023-02-26 DIAGNOSIS — I10 Essential (primary) hypertension: Secondary | ICD-10-CM

## 2023-02-26 LAB — URINE CULTURE: Culture: >=100000 — AB

## 2023-02-26 MED ORDER — AMOXICILLIN 500 MG PO CAPS: 1000.0000 mg | ORAL_CAPSULE | Freq: Three times a day (TID) | ORAL | 0 refills | Status: AC

## 2023-02-26 MED ORDER — ENOXAPARIN SODIUM 80 MG/0.8ML IJ SOSY: 0.5000 mg/kg | PREFILLED_SYRINGE | INTRAMUSCULAR | Status: DC

## 2023-02-26 NOTE — Discharge Instructions (Signed)
Take your blood pressure at home daily.  If starts rising over 140/90 can restart Toprol XL.

## 2023-02-26 NOTE — Assessment & Plan Note (Signed)
Blood pressure on the lower side.  I advised the patient take his blood pressure at home and if it starts creeping above 140/90 can restart the Toprol.

## 2023-02-26 NOTE — Discharge Summary (Signed)
Physician Discharge Summary   Patient: Austin Stevenson MRN: [id number] DOB: May 25, 1996  Admit date:     02/24/2023  Discharge date: 02/26/23  Discharge Physician: Loletha Grayer   PCP: Mylinda Latina, PA-C   Recommendations at discharge:   Follow-up PCP 5 days  Discharge Diagnoses: Principal Problem:   Acute pyelonephritis Active Problems:   Sepsis ([place])   Acute renal failure superimposed on stage 2 chronic kidney disease (HCC)   History of suprapubic catheter   Hypertension   Obesity, Class III, BMI 40-49.9 (morbid obesity) [facility])    Hospital Course: Taken from H&P.  Austin Stevenson is a 27 y.o. male with medical history significant of posttraumatic urethral stricture, s/p of suprapubic catheter placement, hypertension, CKD-II, obesity with BMI 43.06, smoke e-cigarette, who presents with lower back pain for 1 day.  No fever or chills. Patient states that he changed his suprapubic catheter 2 days ago. Pt states the he has 2% functional 1 kidney and 98% of the other.   Data reviewed independently and ED Course: pt was found to have WBC 16.2, worsening renal function with creatinine 2.84, BUN 28, GFR 31 (recent baseline creatinine 1.50 on 07/25/2022), positive urinalysis (turbid appearance, large amount of leukocyte, positive nitrite, many bacteria, WBC> 50).  Chest x-ray showed low volume without infiltration.  CT scan of renal stone    1. There is left-sided pelvocaliectasis and mild left hydroureter. No ureteral calculi are identified. Findings may reflect sequelae of recently passed kidney stone or ascending urinary tract infection. 2. Suprapubic bladder catheter is in place. Multiple stones are identified within the dependent portion of the bladder which measures up to 5 mm. 3. Irregular and asymmetric areas of bladder wall thickening are noted involving the right anterior bladder wall and left lateral bladder wall. Given patient's age, findings are favored to  reflect sequelae of chronic inflammation/infection and/or chronic suprapubic catheter. 4. Asymmetric right renal atrophy. 5. Splenomegaly. 6. Prominent left retroperitoneal and left external iliac lymph nodes are nonspecific but may be reactive in the setting of underlying infection. EKG: Sinus rhythm, QTc 440, early R wave progression, low voltage.    Per ED physician, she discussed with Dr. Delaine Lame ID, who  recommends Zosyn if admitting, ampicillin or amoxicillin if being discharged.  Patient was started on Zosyn.  3/12: Patient febrile with maximum recorded temperature of 100.3 over the past 24 hours, still mild tachycardia.  Patient met sepsis criteria with leukocytosis and tachycardia, secondary to UTI and pyelonephritis.  Labs with improving leukocytosis but all cell lines decreased.  Procalcitonin at 0.57.  Preliminary blood cultures negative in 24 hours.  Urine cultures pending.  3/13.  E. coli growing out of the urine culture and only resistant to Cipro.  Patient received IV Zosyn while here and will go home on high-dose amoxicillin.   Assessment and Plan: * Acute pyelonephritis Patient has sepsis with WBC 16.2, heart rate up to 135.  Lactic acid is normal.  E. coli growing out of cultures.  Patient received Zosyn while here.  Will send home on high-dose amoxicillin 1 g 3 times daily to complete a 10-day course.  Sepsis ([place]) Present on admission.  Secondary to acute pyelonephritis, leukocytosis and tachycardia.  Acute renal failure superimposed on stage 2 chronic kidney disease (HCC) Creatinine 2.84 on presentation down to 1.68 with IV fluid hydration.  History of suprapubic catheter Patient has suprapubic catheter in place since age 63.  Follow-up with urology at [facility]. -Catheter was changed 2 days ago.  Hypertension Blood pressure on the lower side.  I advised the patient take his blood pressure at home and if it starts creeping above 140/90 can restart the  Toprol.  Obesity, Class III, BMI 40-49.9 (morbid obesity) (HCC) Estimated body mass index is 43.06 kg/m as calculated from the following:   Height as of this encounter: 6' (1.829 m).   Weight as of this encounter: 144 kg.   -This will complicate overall prognosis         Consultants: None Procedures performed: None Disposition: Home Diet recommendation:  Cardiac diet DISCHARGE MEDICATION: Allergies as of 02/26/2023   No Known Allergies      Medication List     STOP taking these medications    lisinopril 20 MG tablet Commonly known as: ZESTRIL   metoprolol succinate 50 MG 24 hr tablet Commonly known as: TOPROL-XL       TAKE these medications    amoxicillin 500 MG capsule Commonly known as: AMOXIL Take 2 capsules (1,000 mg total) by mouth 3 (three) times daily for 22 doses.   promethazine 12.5 MG tablet Commonly known as: PHENERGAN Take 12.5 mg by mouth daily as needed.        Follow-up Information     McDonough, Si Gaul, PA-C. Go in 5 day(s).   Specialty: Physician Assistant Why: 03/13/23 at 1:20 PM Contact information: [address] [place] [address] [phone]                Discharge Exam: Danley Danker Weights   02/24/23 0907  Weight: (!) 144 kg   Physical Exam HENT:     Head: Normocephalic.     Mouth/Throat:     Pharynx: No oropharyngeal exudate.  Eyes:     General: Lids are normal.  Cardiovascular:     Rate and Rhythm: Normal rate and regular rhythm.     Heart sounds: Normal heart sounds, S1 normal and S2 normal.  Pulmonary:     Breath sounds: No decreased breath sounds, wheezing, rhonchi or rales.  Abdominal:     Palpations: Abdomen is soft.     Tenderness: There is no abdominal tenderness.  Musculoskeletal:     Right lower leg: No swelling.     Left lower leg: No swelling.  Skin:    General: Skin is warm.     Findings: No rash.  Neurological:     Mental Status: He is alert and oriented to person, place, and time.       Condition at discharge: stable  The results of significant diagnostics from this hospitalization (including imaging, microbiology, ancillary and laboratory) are listed below for reference.   Imaging Studies: CT Renal Stone Study  Result Date: 02/24/2023 CLINICAL DATA:  Abdominal pain/flank pain.  Rule out kidney stone. EXAM: CT ABDOMEN AND PELVIS WITHOUT CONTRAST TECHNIQUE: Multidetector CT imaging of the abdomen and pelvis was performed following the standard protocol without IV contrast. RADIATION DOSE REDUCTION: This exam was performed according to the departmental dose-optimization program which includes automated exposure control, adjustment of the mA and/or kV according to patient size and/or use of iterative reconstruction technique. COMPARISON:  10/04/2004 FINDINGS: Lower chest: Calcified granuloma noted in the left lower lobe. No pleural fluid or airspace disease. Hepatobiliary: No focal liver abnormality is seen. No gallstones, gallbladder wall thickening, or biliary dilatation. Pancreas: Unremarkable. No pancreatic ductal dilatation or surrounding inflammatory changes. Spleen: Spleen measures 13.8 cm in length. No focal splenic abnormality. Adrenals/Urinary Tract: Normal adrenal glands. Asymmetric right renal atrophy. Punctate stone within  the inferior pole of the right kidney measures 2-3 mm. Gas noted within the right renal collecting system. Mild cortical scarring noted involving the anterior and lateral cortex of the left kidney. Cyst or calyceal diverticulum within the upper pole of the left kidney measures 1.9 cm. No follow-up imaging recommended. There is left-sided pelvocaliectasis and mild left hydroureter. No ureteral calculi. Suprapubic bladder catheter. Multiple stones are identified within the dependent portion of the bladder which measures up to 5 mm, image 83/2. Irregular and asymmetric areas of bladder wall thickening are noted involving the right anterior bladder wall and  left lateral bladder wall, image 81/2. Stomach/Bowel: Stomach is normal. The appendix is visualized and is within normal limits. No bowel wall thickening, inflammation, or distension. Vascular/Lymphatic: Normal appearance of the abdominal aorta. Prominent left retroperitoneal lymph nodes measure up to 1.1 cm, image 35/2.Left external iliac lymph node measures 1 cm, image 79/2. Reproductive: Prostate gland measures 5.4 by 5.6 by 5.4 cm (volume = 86 cm^3). Other: No free fluid or fluid collections.  No pneumoperitoneum. Musculoskeletal: No acute or significant osseous findings. IMPRESSION: 1. There is left-sided pelvocaliectasis and mild left hydroureter. No ureteral calculi are identified. Findings may reflect sequelae of recently passed kidney stone or ascending urinary tract infection. 2. Suprapubic bladder catheter is in place. Multiple stones are identified within the dependent portion of the bladder which measures up to 5 mm. 3. Irregular and asymmetric areas of bladder wall thickening are noted involving the right anterior bladder wall and left lateral bladder wall. Given patient's age, findings are favored to reflect sequelae of chronic inflammation/infection and/or chronic suprapubic catheter. 4. Asymmetric right renal atrophy. 5. Splenomegaly. 6. Prominent left retroperitoneal and left external iliac lymph nodes are nonspecific but may be reactive in the setting of underlying infection. Electronically Signed   By: Kerby Moors M.D.   On: 02/24/2023 11:34   DG Chest Port 1 View  Result Date: 02/24/2023 CLINICAL DATA:  Questionable sepsis EXAM: PORTABLE CHEST 1 VIEW COMPARISON:  No recent examination available for comparison FINDINGS: The heart size and mediastinal contours are within normal limits. Low lung volumes without evidence of focal consolidation or pleural effusion. The visualized skeletal structures are unremarkable. IMPRESSION: Low lung volumes without evidence of acute cardiopulmonary  process. Electronically Signed   By: Keane Police D.O.   On: 02/24/2023 10:44    Microbiology: Results for orders placed or performed during the hospital encounter of 02/24/23  Urine Culture (for pregnant, neutropenic or urologic patients or patients with an indwelling urinary catheter)     Status: Abnormal   Collection Time: 02/24/23  9:36 AM   Specimen: Urine, Suprapubic  Result Value Ref Range Status   Specimen Description   Final    URINE, SUPRAPUBIC Performed at [facility], [address], [place], [place] [address]    Special Requests   Final    NONE Performed at [facility], [address] [place],  [address]    Culture >=100,000 COLONIES/mL ESCHERICHIA COLI (A)  Final   Report Status 02/26/2023 FINAL  Final   Organism ID, Bacteria ESCHERICHIA COLI (A)  Final      Susceptibility   Escherichia coli - MIC*    AMPICILLIN 4 SENSITIVE Sensitive     CEFAZOLIN <=4 SENSITIVE Sensitive     CEFEPIME <=0.12 SENSITIVE Sensitive     CEFTRIAXONE <=0.25 SENSITIVE Sensitive     CIPROFLOXACIN >=4 RESISTANT Resistant     GENTAMICIN <=1 SENSITIVE Sensitive  IMIPENEM <=0.25 SENSITIVE Sensitive     NITROFURANTOIN <=16 SENSITIVE Sensitive     TRIMETH/SULFA <=20 SENSITIVE Sensitive     AMPICILLIN/SULBACTAM <=2 SENSITIVE Sensitive     PIP/TAZO <=4 SENSITIVE Sensitive     * >=100,000 COLONIES/mL ESCHERICHIA COLI  Resp panel by RT-PCR (RSV, Flu A&B, Covid) Urine, Clean Catch     Status: None   Collection Time: 02/24/23  9:54 AM   Specimen: Urine, Clean Catch; Nasal Swab  Result Value Ref Range Status   SARS Coronavirus 2 by RT PCR NEGATIVE NEGATIVE Final    Comment: (NOTE) SARS-CoV-2 target nucleic acids are NOT DETECTED.  The SARS-CoV-2 RNA is generally detectable in upper respiratory specimens during the acute phase of infection. The lowest concentration of SARS-CoV-2 viral copies this assay can detect is 138 copies/mL. A negative result does not preclude  SARS-Cov-2 infection and should not be used as the sole basis for treatment or other patient management decisions. A negative result may occur with  improper specimen collection/handling, submission of specimen other than nasopharyngeal swab, presence of viral mutation(s) within the areas targeted by this assay, and inadequate number of viral copies(<138 copies/mL). A negative result must be combined with clinical observations, patient history, and epidemiological information. The expected result is Negative.  Fact Sheet for Patients:  EntrepreneurPulse.com.au  Fact Sheet for Healthcare Providers:  IncredibleEmployment.be  This test is no t yet approved or cleared by the [place] FDA and  has been authorized for detection and/or diagnosis of SARS-CoV-2 by FDA under an Emergency Use Authorization (EUA). This EUA will remain  in effect (meaning this test can be used) for the duration of the COVID-19 declaration under Section 564(b)(1) of the Act, 21 U.S.C.section 360bbb-3(b)(1), unless the authorization is terminated  or revoked sooner.       Influenza A by PCR NEGATIVE NEGATIVE Final   Influenza B by PCR NEGATIVE NEGATIVE Final    Comment: (NOTE) The Xpert Xpress SARS-CoV-2/FLU/RSV plus assay is intended as an aid in the diagnosis of influenza from Nasopharyngeal swab specimens and should not be used as a sole basis for treatment. Nasal washings and aspirates are unacceptable for Xpert Xpress SARS-CoV-2/FLU/RSV testing.  Fact Sheet for Patients: EntrepreneurPulse.com.au  Fact Sheet for Healthcare Providers: IncredibleEmployment.be  This test is not yet approved or cleared by the [place] FDA and has been authorized for detection and/or diagnosis of SARS-CoV-2 by FDA under an Emergency Use Authorization (EUA). This EUA will remain in effect (meaning this test can be used) for the duration of  the COVID-19 declaration under Section 564(b)(1) of the Act, 21 U.S.C. section 360bbb-3(b)(1), unless the authorization is terminated or revoked.     Resp Syncytial Virus by PCR NEGATIVE NEGATIVE Final    Comment: (NOTE) Fact Sheet for Patients: EntrepreneurPulse.com.au  Fact Sheet for Healthcare Providers: IncredibleEmployment.be  This test is not yet approved or cleared by the [place] FDA and has been authorized for detection and/or diagnosis of SARS-CoV-2 by FDA under an Emergency Use Authorization (EUA). This EUA will remain in effect (meaning this test can be used) for the duration of the COVID-19 declaration under Section 564(b)(1) of the Act, 21 U.S.C. section 360bbb-3(b)(1), unless the authorization is terminated or revoked.  Performed at [facility], [address] [place], [place] [address]   Culture, blood (Routine X 2) w Reflex to ID Panel     Status: None (Preliminary result)   Collection Time: 02/24/23  1:39 PM   Specimen: BLOOD LEFT ARM  Result Value Ref Range Status   Specimen Description BLOOD LEFT ARM  Final   Special Requests   Final    BOTTLES DRAWN AEROBIC AND ANAEROBIC Blood Culture adequate volume   Culture   Final    NO GROWTH 2 DAYS Performed at [facility], [address] [place], [place] [address]    Report Status PENDING  Incomplete  Culture, blood (Routine X 2) w Reflex to ID Panel     Status: None (Preliminary result)   Collection Time: 02/24/23  1:39 PM   Specimen: BLOOD RIGHT HAND  Result Value Ref Range Status   Specimen Description BLOOD RIGHT HAND  Final   Special Requests   Final    BOTTLES DRAWN AEROBIC AND ANAEROBIC Blood Culture adequate volume   Culture   Final    NO GROWTH 2 DAYS Performed at [facility], [address] [place], [place] [address]    Report Status PENDING  Incomplete    Labs: CBC: Recent Labs  Lab 02/24/23 0936 02/25/23 0702   WBC 16.2* 11.2*  HGB 14.4 12.0*  HCT 44.3 36.5*  MCV 85.4 84.7  PLT 241 [id number]   Basic Metabolic Panel: Recent Labs  Lab 02/24/23 0936 02/25/23 0702  NA 133* 133*  K 4.0 3.7  CL 101 108  CO2 22 19*  GLUCOSE 115* 90  BUN 28* 23*  CREATININE 2.84* 1.68*  CALCIUM 9.3 8.4*   Liver Function Tests: Recent Labs  Lab 02/24/23 0936  AST 18  ALT 17  ALKPHOS 65  BILITOT 2.2*  PROT 8.7*  ALBUMIN 3.9    Discharge time spent: greater than 30 minutes.  Signed: Loletha Grayer, MD [facility] 02/26/2023

## 2023-02-26 NOTE — Assessment & Plan Note (Signed)
Creatinine 2.84 on presentation down to 1.68 with IV fluid hydration.

## 2023-02-26 NOTE — Assessment & Plan Note (Signed)
Estimated body mass index is 43.06 kg/m as calculated from the following:   Height as of this encounter: 6' (1.829 m).   Weight as of this encounter: 144 kg.   -This will complicate overall prognosis

## 2023-02-26 NOTE — Progress Notes (Signed)
PHARMACIST - PHYSICIAN COMMUNICATION  CONCERNING:  Enoxaparin (Lovenox) for DVT Prophylaxis    RECOMMENDATION: Patient was prescribed enoxaprin '40mg'$  q24 hours for VTE prophylaxis.   Filed Weights   02/24/23 0907  Weight: (!) 144 kg (317 lb 7.4 oz)    Body mass index is 43.06 kg/m.  Estimated Creatinine Clearance: 99.9 mL/min (A) (by C-G formula based on SCr of 1.68 mg/dL (H)).   Based on [place] patient is candidate for enoxaparin 0.'5mg'$ /kg TBW SQ every 24 hours based on BMI being >30.  DESCRIPTION: Pharmacy has adjusted enoxaparin dose per [facility] policy.  Patient is now receiving enoxaparin 72.5 mg every 24 hours    Lorin Picket, PharmD Clinical Pharmacist  02/26/2023 8:09 AM

## 2023-02-26 NOTE — Assessment & Plan Note (Signed)
Patient has sepsis with WBC 16.2, heart rate up to 135.  Lactic acid is normal.  E. coli growing out of cultures.  Patient received Zosyn while here.  Will send home on high-dose amoxicillin 1 g 3 times daily to complete a 10-day course.

## 2023-02-26 NOTE — TOC CM/SW Note (Signed)
  Transition of Care [facility]) Screening Note   Patient Details  Name: Austin Stevenson Date of Birth: 15-Feb-1996   [facility] of [facility]) CM/SW Contact:    Candie Chroman, LCSW Phone Number: 02/26/2023, 8:48 AM    Transition of Care Department [facility]) has reviewed patient and no TOC needs have been identified at this time. We will continue to monitor patient advancement through interdisciplinary progression rounds. If new patient transition needs arise, please place a TOC consult.

## 2023-02-26 NOTE — Assessment & Plan Note (Signed)
Present on admission.  Secondary to acute pyelonephritis, leukocytosis and tachycardia.

## 2023-02-26 NOTE — Assessment & Plan Note (Signed)
Patient has suprapubic catheter in place since age 27.  Follow-up with urology at [facility]. -Catheter was changed 2 days ago.

## 2023-03-01 LAB — CULTURE, BLOOD (ROUTINE X 2)
Culture: NO GROWTH
Culture: NO GROWTH
Special Requests: ADEQUATE
Special Requests: ADEQUATE

## 2023-03-13 ENCOUNTER — Ambulatory Visit (INDEPENDENT_AMBULATORY_CARE_PROVIDER_SITE_OTHER): Payer: Medicaid Other | Admitting: Physician Assistant

## 2023-03-13 ENCOUNTER — Encounter: Payer: Self-pay | Admitting: Physician Assistant

## 2023-03-13 VITALS — BP 131/85 | HR 97 | Temp 98.0°F | Resp 16 | Ht 72.0 in | Wt 316.0 lb

## 2023-03-13 DIAGNOSIS — R000 Tachycardia, unspecified: Secondary | ICD-10-CM

## 2023-03-13 DIAGNOSIS — I151 Hypertension secondary to other renal disorders: Secondary | ICD-10-CM

## 2023-03-13 DIAGNOSIS — N12 Tubulo-interstitial nephritis, not specified as acute or chronic: Secondary | ICD-10-CM | POA: Diagnosis not present

## 2023-03-13 DIAGNOSIS — R300 Dysuria: Secondary | ICD-10-CM

## 2023-03-13 DIAGNOSIS — Z09 Encounter for follow-up examination after completed treatment for conditions other than malignant neoplasm: Secondary | ICD-10-CM

## 2023-03-13 MED ORDER — LISINOPRIL 20 MG PO TABS: 20.0000 mg | ORAL_TABLET | Freq: Every day | ORAL | 3 refills | Status: AC

## 2023-03-13 MED ORDER — METOPROLOL SUCCINATE ER 100 MG PO TB24: 100.0000 mg | ORAL_TABLET | Freq: Every day | ORAL | 3 refills | Status: AC

## 2023-03-13 NOTE — Progress Notes (Signed)
[facility] [address] [place], [place] [address]  Internal MEDICINE  Office Visit Note  Patient Name: Austin GarbeJeremy D Stevenson  [id number] 13, 1997  [id number]  Date of Service: 03/21/2023  Chief Complaint  Patient presents with   Hospitalization Follow-up    Patient was in the hospital for 3 kidney stones    HPI Pt is here for hospital follow up for acute pyelonephritis and kidney stones  Hospital Course Per ED discharge summary: Austin Stevenson is a 27 y.o. male with medical history significant of posttraumatic urethral stricture, s/p of suprapubic catheter placement, hypertension, CKD-II, obesity with BMI 43.06, smoke e-cigarette, who presents with lower back pain for 1 day.  No fever or chills. Patient states that he changed his suprapubic catheter 2 days ago. Pt states the he has 2% functional 1 kidney and 98% of the other.    Data reviewed independently and ED Course: pt was found to have WBC 16.2, worsening renal function with creatinine 2.84, BUN 28, GFR 31 (recent baseline creatinine 1.50 on 07/25/2022), positive urinalysis (turbid appearance, large amount of leukocyte, positive nitrite, many bacteria, WBC> 50).  Chest x-ray showed low volume without infiltration.  CT scan of renal stone    1. There is left-sided pelvocaliectasis and mild left hydroureter. No ureteral calculi are identified. Findings may reflect sequelae of recently passed kidney stone or ascending urinary tract infection. 2. Suprapubic bladder catheter is in place. Multiple stones are identified within the dependent portion of the bladder which measures up to 5 mm. 3. Irregular and asymmetric areas of bladder wall thickening are noted involving the right anterior bladder wall and left lateral bladder wall. Given patient's age, findings are favored to reflect sequelae of chronic inflammation/infection and/or chronic suprapubic catheter. 4. Asymmetric right renal atrophy. 5. Splenomegaly. 6. Prominent left  retroperitoneal and left external iliac lymph nodes are nonspecific but may be reactive in the setting of underlying infection. EKG: Sinus rhythm, QTc 440, early R wave progression, low voltage.     Per ED physician, she discussed with Dr. Rivka Saferavishankar ID, who  recommends Zosyn if admitting, ampicillin or amoxicillin if being discharged.  Patient was started on Zosyn.   3/12: Patient febrile with maximum recorded temperature of 100.3 over the past 24 hours, still mild tachycardia.  Patient met sepsis criteria with leukocytosis and tachycardia, secondary to UTI and pyelonephritis.  Labs with improving leukocytosis but all cell lines decreased.  Procalcitonin at 0.57.  Preliminary blood cultures negative in 24 hours.  Urine cultures pending.   3/13.  E. coli growing out of the urine culture and only resistant to Cipro.  Patient received IV Zosyn while here and will go home on high-dose amoxicillin.  Follow Up Encounter: -Completed amoxicillin course based on urine culture -Feeling much better now, no low back or abdominal pain  -Seeing urology next month and nephrology in May -Had paused BP meds while in the hospital, but is back on these. HR still elevated, will go ahead and increase to 100mg  tab instead of taking 1.5 tabs (75mg ) -will go ahead and recheck urine to ensure resolution as patient is high risk of re-infection/complicated course. Will trreat accordingly until patient can get in with urology  Current Medication: Outpatient Encounter Medications as of 03/13/2023  Medication Sig   lisinopril (ZESTRIL) 20 MG tablet Take 1 tablet (20 mg total) by mouth daily.   metoprolol succinate (TOPROL-XL) 100 MG 24 hr tablet Take 1 tablet (100 mg total) by mouth daily. Take with or immediately  following a meal.   promethazine (PHENERGAN) 12.5 MG tablet Take 12.5 mg by mouth daily as needed.   sulfamethoxazole-trimethoprim (BACTRIM DS) 800-160 MG tablet Take 1 tablet by mouth 2 (two) times daily.    No facility-administered encounter medications on file as of 03/13/2023.    Surgical History: Past Surgical History:  Procedure Laterality Date   ABDOMINAL SURGERY     ABSCESS DRAINAGE  11/15/2005   creation of cathetrizable stoma     cystopic catheter placement   11/0/2010   CYSTOURETHROSCOPY     dilation of stricture  09/12/2008   EPISPADIUS CORRECTION     ESOPHAGOGASTRODUODENOSCOPY (EGD) WITH PROPOFOL N/A 11/25/2016   Procedure: ESOPHAGOGASTRODUODENOSCOPY (EGD) WITH PROPOFOL;  Surgeon: Midge Miniumarren Wohl, MD;  Location: [facility] ENDOSCOPY;  Service: Endoscopy;  Laterality: N/A;   MITROFANOFF PROCEDURE  09/10/2005   SUPRAPUBIC CATHETER PLACEMENT  09/28/2005   TRANSRECTAL DRAINAGE OF PELVIC ABSCESS     URETHRAL DILATION      Medical History: 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    Urethral fistula     Family History: Family History  Problem Relation Age of Onset   Diabetes Mother    Hypertension Paternal Grandfather     Social History   Socioeconomic History   Marital status: Single    Spouse name: Not on file   Number of children: Not on file   Years of education: Not on file   Highest education level: Not on file  Occupational History   Not on file  Tobacco Use   Smoking status: Every Day    Types: E-cigarettes   Smokeless tobacco: Never   Tobacco comments:    "Several times a day"  Vaping Use   Vaping Use: Every day   Devices: e cigarettes   Substance and Sexual Activity   Alcohol use: No   Drug use: No   Sexual activity: Yes  Other Topics Concern   Not on file  Social History Narrative   Not on file   Social Determinants of Health   Financial Resource Strain: Not on file  Food Insecurity: No Food Insecurity (02/24/2023)   Hunger Vital Sign    Worried About Running Out of Food in the Last Year: Never true    Ran Out of Food in  the Last Year: Never true  Transportation Needs: No Transportation Needs (02/24/2023)   PRAPARE - Administrator, Civil ServiceTransportation    Lack of Transportation (Medical): No    Lack of Transportation (Non-Medical): No  Physical Activity: Not on file  Stress: Not on file  Social Connections: Not on file  Intimate Partner Violence: Not At Risk (02/24/2023)   Humiliation, Afraid, Rape, and Kick questionnaire    Fear of Current or Ex-Partner: No    Emotionally Abused: No    Physically Abused: No    Sexually Abused: No      Review of Systems  Constitutional:  Negative for chills, fatigue and unexpected weight change.  HENT:  Negative for congestion, postnasal drip, rhinorrhea, sneezing and sore throat.   Eyes:  Negative for redness.  Respiratory:  Negative for cough, chest tightness and shortness of breath.   Cardiovascular:  Negative for chest pain and palpitations.  Gastrointestinal:  Negative for abdominal pain, constipation, diarrhea, nausea and vomiting.  Genitourinary:  Negative for dysuria and frequency.  Musculoskeletal:  Negative for arthralgias,  back pain, joint swelling and neck pain.  Skin:  Negative for rash.  Neurological: Negative.  Negative for tremors and numbness.  Hematological:  Negative for adenopathy. Does not bruise/bleed easily.  Psychiatric/Behavioral:  Negative for behavioral problems (Depression), sleep disturbance and suicidal ideas. The patient is not nervous/anxious.     Vital Signs: BP 131/85   Pulse 97   Temp 98 F (36.7 C)   Resp 16   Ht 6' (1.829 m)   Wt (!) 316 lb (143.3 kg)   BMI 42.86 kg/m    Physical Exam Vitals and nursing note reviewed.  Constitutional:      General: He is not in acute distress.    Appearance: Normal appearance. He is well-developed. He is obese. He is not diaphoretic.  HENT:     Head: Normocephalic and atraumatic.     Mouth/Throat:     Pharynx: No oropharyngeal exudate.  Eyes:     Pupils: Pupils are equal, round, and reactive to  light.  Neck:     Thyroid: No thyromegaly.     Vascular: No JVD.     Trachea: No tracheal deviation.  Cardiovascular:     Rate and Rhythm: Normal rate and regular rhythm.     Heart sounds: Normal heart sounds. No murmur heard.    No friction rub. No gallop.  Pulmonary:     Effort: Pulmonary effort is normal. No respiratory distress.     Breath sounds: No wheezing or rales.  Chest:     Chest wall: No tenderness.  Abdominal:     Palpations: Abdomen is soft.  Musculoskeletal:        General: Normal range of motion.     Cervical back: Normal range of motion and neck supple.  Lymphadenopathy:     Cervical: No cervical adenopathy.  Skin:    General: Skin is warm and dry.  Neurological:     Mental Status: He is alert and oriented to person, place, and time.     Cranial Nerves: No cranial nerve deficit.  Psychiatric:        Behavior: Behavior normal.        Thought Content: Thought content normal.        Judgment: Judgment normal.        Assessment/Plan: 1. Hospital discharge follow-up Reviewed hospital course  2. Hypertension secondary to other renal disorders Will increase metoprolol and continue to monitor - lisinopril (ZESTRIL) 20 MG tablet; Take 1 tablet (20 mg total) by mouth daily.  Dispense: 90 tablet; Refill: 3 - metoprolol succinate (TOPROL-XL) 100 MG 24 hr tablet; Take 1 tablet (100 mg total) by mouth daily. Take with or immediately following a meal.  Dispense: 90 tablet; Refill: 3  3. Tachycardia Increase metoprolol from 75 to 100mg  daily - metoprolol succinate (TOPROL-XL) 100 MG 24 hr tablet; Take 1 tablet (100 mg total) by mouth daily. Take with or immediately following a meal.  Dispense: 90 tablet; Refill: 3  4. Pyelonephritis Symptomatic improvement, will recheck urine  5. Dysuria Upon urine culture still evidence of UTI and will go ahead and treat with another course of ABX - UA/M w/rflx Culture, Routine - Microscopic Examination - Urine Culture,  Reflex - sulfamethoxazole-trimethoprim (BACTRIM DS) 800-160 MG tablet; Take 1 tablet by mouth 2 (two) times daily.  Dispense: 14 tablet; Refill: 0   General Counseling: sundeep destin understanding of the findings of todays visit and agrees with plan of treatment. I have discussed any further diagnostic evaluation that may be needed  or ordered today. We also reviewed his medications today. he has been encouraged to call the office with any questions or concerns that should arise related to todays visit.    Orders Placed This Encounter  Procedures   Microscopic Examination   Urine Culture, Reflex   UA/M w/rflx Culture, Routine    Meds ordered this encounter  Medications   lisinopril (ZESTRIL) 20 MG tablet    Sig: Take 1 tablet (20 mg total) by mouth daily.    Dispense:  90 tablet    Refill:  3   metoprolol succinate (TOPROL-XL) 100 MG 24 hr tablet    Sig: Take 1 tablet (100 mg total) by mouth daily. Take with or immediately following a meal.    Dispense:  90 tablet    Refill:  3   sulfamethoxazole-trimethoprim (BACTRIM DS) 800-160 MG tablet    Sig: Take 1 tablet by mouth 2 (two) times daily.    Dispense:  14 tablet    Refill:  0    This patient was seen by Lynn Ito, PA-C in collaboration with Dr. Beverely Risen as a part of collaborative care agreement.   Total time spent:35 Minutes Time spent includes review of chart, medications, test results, and follow up plan with the patient.      Dr Lyndon Code Internal medicine

## 2023-03-17 ENCOUNTER — Ambulatory Visit: Payer: Medicaid Other | Admitting: Physician Assistant

## 2023-03-21 LAB — UA/M W/RFLX CULTURE, ROUTINE
Bilirubin, UA: NEGATIVE
Glucose, UA: NEGATIVE
Ketones, UA: NEGATIVE
Nitrite, UA: NEGATIVE
RBC, UA: NEGATIVE
Specific Gravity, UA: 1.015 (ref 1.005–1.030)
Urobilinogen, Ur: 0.2 mg/dL (ref 0.2–1.0)
pH, UA: 5.5 (ref 5.0–7.5)

## 2023-03-21 LAB — URINE CULTURE, REFLEX

## 2023-03-21 LAB — MICROSCOPIC EXAMINATION
Casts: NONE SEEN /lpf
Epithelial Cells (non renal): NONE SEEN /hpf (ref 0–10)
RBC, Urine: NONE SEEN /hpf (ref 0–2)
WBC, UA: >30 /hpf — AB (ref 0–5)

## 2023-03-21 MED ORDER — SULFAMETHOXAZOLE-TRIMETHOPRIM 800-160 MG PO TABS: 1.0000 | ORAL_TABLET | Freq: Two times a day (BID) | ORAL | 0 refills | Status: DC

## 2023-04-04 ENCOUNTER — Telehealth: Payer: Self-pay | Admitting: Physician Assistant

## 2023-04-04 NOTE — Telephone Encounter (Signed)
Sent message to patient notifying him his 07/21/23 appointment with Leotis Shames has been moved to Alyssa's schedule for that day @ 1:40-Austin Stevenson

## 2023-06-15 ENCOUNTER — Other Ambulatory Visit: Payer: Self-pay

## 2023-06-15 ENCOUNTER — Emergency Department
Admission: EM | Admit: 2023-06-15 | Discharge: 2023-06-15 | Disposition: A | Discharge status: Home / Self Care | Payer: Medicaid Other | Attending: Emergency Medicine | Admitting: Emergency Medicine

## 2023-06-15 ENCOUNTER — Emergency Department: Payer: Medicaid Other

## 2023-06-15 DIAGNOSIS — R1031 Right lower quadrant pain: Secondary | ICD-10-CM | POA: Diagnosis present

## 2023-06-15 LAB — URINALYSIS, ROUTINE W REFLEX MICROSCOPIC
Bilirubin Urine: NEGATIVE
Glucose, UA: NEGATIVE mg/dL
Hgb urine dipstick: NEGATIVE
Ketones, ur: NEGATIVE mg/dL
Nitrite: POSITIVE — AB
Protein, ur: 30 mg/dL — AB
Specific Gravity, Urine: 1.016 (ref 1.005–1.030)
Squamous Epithelial / HPF: NONE SEEN /HPF (ref 0–5)
WBC, UA: >50 WBC/hpf (ref 0–5)
pH: 5 (ref 5.0–8.0)

## 2023-06-15 LAB — CBC
HCT: 43.5 % (ref 39.0–52.0)
Hemoglobin: 14.3 g/dL (ref 13.0–17.0)
MCH: 27.9 pg (ref 26.0–34.0)
MCHC: 32.9 g/dL (ref 30.0–36.0)
MCV: 84.8 fL (ref 80.0–100.0)
Platelets: 235 10*3/uL (ref 150–400)
RBC: 5.13 MIL/uL (ref 4.22–5.81)
RDW: 13.3 % (ref 11.5–15.5)
WBC: 6.4 10*3/uL (ref 4.0–10.5)
nRBC: 0 % (ref 0.0–0.2)

## 2023-06-15 LAB — LIPASE, BLOOD: Lipase: 32 U/L (ref 11–51)

## 2023-06-15 LAB — COMPREHENSIVE METABOLIC PANEL
ALT: 22 U/L (ref 0–44)
AST: 22 U/L (ref 15–41)
Albumin: 3.9 g/dL (ref 3.5–5.0)
Alkaline Phosphatase: 62 U/L (ref 38–126)
Anion gap: 4 — ABNORMAL LOW (ref 5–15)
BUN: 19 mg/dL (ref 6–20)
CO2: 20 mmol/L — ABNORMAL LOW (ref 22–32)
Calcium: 8.7 mg/dL — ABNORMAL LOW (ref 8.9–10.3)
Chloride: 111 mmol/L (ref 98–111)
Creatinine, Ser: 1.29 mg/dL — ABNORMAL HIGH (ref 0.61–1.24)
GFR, Estimated: >60 mL/min (ref >60)
Glucose, Bld: 110 mg/dL — ABNORMAL HIGH (ref 70–99)
Potassium: 4 mmol/L (ref 3.5–5.1)
Sodium: 135 mmol/L (ref 135–145)
Total Bilirubin: 0.7 mg/dL (ref 0.3–1.2)
Total Protein: 7.7 g/dL (ref 6.5–8.1)

## 2023-06-15 MED ORDER — ONDANSETRON HCL 4 MG/2ML IJ SOLN
4.0000 mg | INTRAMUSCULAR | Status: AC
  Administered 2023-06-15: 4 mg via INTRAVENOUS
  Filled 2023-06-15: qty 2

## 2023-06-15 MED ORDER — MORPHINE SULFATE (PF) 4 MG/ML IV SOLN
4.0000 mg | Freq: Once | INTRAVENOUS | Status: AC
  Administered 2023-06-15: 4 mg via INTRAVENOUS
  Filled 2023-06-15: qty 1

## 2023-06-15 MED ORDER — MORPHINE SULFATE (PF) 4 MG/ML IV SOLN
4.0000 mg | INTRAVENOUS | Status: DC | PRN
  Administered 2023-06-15: 4 mg via INTRAVENOUS
  Filled 2023-06-15: qty 1

## 2023-06-15 MED ORDER — SODIUM CHLORIDE 0.9 % IV BOLUS
1000.0000 mL | Freq: Once | INTRAVENOUS | Status: AC
  Administered 2023-06-15: 1000 mL via INTRAVENOUS

## 2023-06-15 NOTE — ED Notes (Signed)
Pt to CT at this time.

## 2023-06-15 NOTE — ED Notes (Signed)
EMTALA reviewed by this RN. Pt signed hard copy of transfer consent.

## 2023-06-15 NOTE — ED Provider Notes (Signed)
Patient accepted in transfer to [facility].  Patient understanding and agreeable with plan.  We will have him here to our ER this evening until bed becomes available at [facility].  [facility] transfer center advising that he should have a bed this evening  Benefits and risks of transfer discussed with patient who is agreeable to go to [facility] for urology care   Sharyn Creamer, MD 06/15/23 1441
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
## 2023-06-15 NOTE — ED Provider Notes (Signed)
Vitals:   06/15/23 1559 06/15/23 1600  BP:  110/69  Pulse:  94  Resp:  18  Temp: 98.7 F (37.1 C)   SpO2:  97%     Patient resting comfortably.  Currently eating.  In no distress.  Stable for transfer to [facility] for continuity of care and urology evaluation   Sharyn Creamer, MD 06/15/23 [phone]

## 2023-06-15 NOTE — ED Triage Notes (Signed)
Pt states yesterday he started to have severe pain to the RLQ of the abdomen. Pt states foul smelling urine and states he still has his appendix. Pt denies nausea, vomiting and diarrhea.

## 2023-06-17 LAB — URINE CULTURE: Culture: >=100000 — AB

## 2023-06-18 NOTE — Consult Note (Addendum)
Urine culture positive for e.coli only resistant to cipro. Pt was transferred to [facility] for continuation of care. Per Care everywhere notes, patient was prescribed augmentin x 7 days.   amoxicillin-clavulanate (AUGMENTIN) 875-125 mg tablet  Take 1 tablet (875 mg total) by mouth 2 (two) times daily for 7 days 14 tablet   06/16/2023 to 06/23/2023.   Paschal Dopp, PharmD, BCPS

## 2023-07-21 ENCOUNTER — Encounter: Payer: Self-pay | Admitting: Nurse Practitioner

## 2023-07-21 ENCOUNTER — Ambulatory Visit (INDEPENDENT_AMBULATORY_CARE_PROVIDER_SITE_OTHER): Payer: Medicaid Other | Admitting: Nurse Practitioner

## 2023-07-21 VITALS — BP 110/72 | HR 92 | Temp 98.6°F | Resp 16 | Ht 72.0 in | Wt 308.4 lb

## 2023-07-21 DIAGNOSIS — N390 Urinary tract infection, site not specified: Secondary | ICD-10-CM | POA: Diagnosis not present

## 2023-07-21 DIAGNOSIS — R000 Tachycardia, unspecified: Secondary | ICD-10-CM | POA: Diagnosis not present

## 2023-07-21 DIAGNOSIS — I151 Hypertension secondary to other renal disorders: Secondary | ICD-10-CM

## 2023-07-21 MED ORDER — SULFAMETHOXAZOLE-TRIMETHOPRIM 800-160 MG PO TABS: 1.0000 | ORAL_TABLET | Freq: Two times a day (BID) | ORAL | 0 refills | Status: DC

## 2023-07-21 NOTE — Progress Notes (Signed)
[facility] [address] [place], [place] [address]  Internal MEDICINE  Office Visit Note  Patient Name: Austin Stevenson  [id number]  [id number]  Date of Service: 07/21/2023  Chief Complaint  Patient presents with   Hypertension   Follow-up    HPI Kendriel presents for a follow-up visit for hypertension, tachycardia, and recurrent UTI.  Hypertension -- stable on lisinopril and metoprolol succinate.  Tachycardia -- HR 92, has stayed below 100 bpm, denies palpitations.   Denies  UTI sx, asking for prophylactic course of antibiotiic to keep on hand.     Current Medication: Outpatient Encounter Medications as of 07/21/2023  Medication Sig   lisinopril (ZESTRIL) 20 MG tablet Take 1 tablet (20 mg total) by mouth daily.   metoprolol succinate (TOPROL-XL) 100 MG 24 hr tablet Take 1 tablet (100 mg total) by mouth daily. Take with or immediately following a meal.   promethazine (PHENERGAN) 12.5 MG tablet Take 12.5 mg by mouth daily as needed.   sulfamethoxazole-trimethoprim (BACTRIM DS) 800-160 MG tablet Take 1 tablet by mouth 2 (two) times daily.   [DISCONTINUED] sulfamethoxazole-trimethoprim (BACTRIM DS) 800-160 MG tablet Take 1 tablet by mouth 2 (two) times daily.   No facility-administered encounter medications on file as of 07/21/2023.    Surgical History: Past Surgical History:  Procedure Laterality Date   ABDOMINAL SURGERY     ABSCESS DRAINAGE  11/15/2005   creation of cathetrizable stoma     cystopic catheter placement   11/0/2010   CYSTOURETHROSCOPY     dilation of stricture  09/12/2008   EPISPADIUS CORRECTION     ESOPHAGOGASTRODUODENOSCOPY (EGD) WITH PROPOFOL N/A 11/25/2016   Procedure: ESOPHAGOGASTRODUODENOSCOPY (EGD) WITH PROPOFOL;  Surgeon: Midge Minium, MD;  Location: [facility] ENDOSCOPY;  Service: Endoscopy;  Laterality: N/A;   MITROFANOFF PROCEDURE  09/10/2005   SUPRAPUBIC CATHETER PLACEMENT  09/28/2005   TRANSRECTAL DRAINAGE OF PELVIC ABSCESS     URETHRAL  DILATION      Medical History: 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    Urethral fistula     Family History: Family History  Problem Relation Age of Onset   Diabetes Mother    Hypertension Paternal Grandfather     Social History   Socioeconomic History   Marital status: Single    Spouse name: Not on file   Number of children: Not on file   Years of education: Not on file   Highest education level: Not on file  Occupational History   Not on file  Tobacco Use   Smoking status: Every Day    Types: E-cigarettes   Smokeless tobacco: Never   Tobacco comments:    "Several times a day"  Vaping Use   Vaping status: Every Day   Devices: e cigarettes   Substance and Sexual Activity   Alcohol use: No   Drug use: No   Sexual activity: Yes  Other Topics Concern   Not on file  Social History Narrative   Not on file   Social Determinants of Health   Financial Resource Strain: Not on file  Food Insecurity: No Food Insecurity (02/24/2023)   Hunger Vital Sign    Worried About Running Out of Food in the Last Year: Never true    Ran Out of Food in the Last Year: Never true  Transportation Needs: No Transportation Needs (  02/24/2023)   PRAPARE - Administrator, Civil Service (Medical): No    Lack of Transportation (Non-Medical): No  Physical Activity: Not on file  Stress: Not on file  Social Connections: Not on file  Intimate Partner Violence: Not At Risk (02/24/2023)   Humiliation, Afraid, Rape, and Kick questionnaire    Fear of Current or Ex-Partner: No    Emotionally Abused: No    Physically Abused: No    Sexually Abused: No      Review of Systems  Constitutional:  Negative for chills, fatigue and unexpected weight change.  HENT: Negative.  Negative for congestion, postnasal drip, rhinorrhea, sneezing and sore throat.   Eyes:   Negative for redness.  Respiratory: Negative.  Negative for cough, chest tightness, shortness of breath and wheezing.   Cardiovascular: Negative.  Negative for chest pain and palpitations.  Gastrointestinal: Negative.  Negative for abdominal pain, constipation, diarrhea, nausea and vomiting.  Genitourinary:  Negative for dysuria and frequency.  Musculoskeletal: Negative.  Negative for arthralgias, back pain, joint swelling and neck pain.  Skin:  Negative for rash.  Neurological: Negative.  Negative for tremors and numbness.  Hematological:  Negative for adenopathy. Does not bruise/bleed easily.  Psychiatric/Behavioral:  Negative for behavioral problems (Depression), sleep disturbance and suicidal ideas. The patient is not nervous/anxious.     Vital Signs: BP 110/72   Pulse 92   Temp 98.6 F (37 C)   Resp 16   Ht 6' (1.829 m)   Wt (!) 308 lb 6.4 oz (139.9 kg)   SpO2 99%   BMI 41.83 kg/m    Physical Exam Vitals reviewed.  Constitutional:      General: He is not in acute distress.    Appearance: Normal appearance. He is obese. He is not ill-appearing.  HENT:     Head: Normocephalic and atraumatic.  Eyes:     Pupils: Pupils are equal, round, and reactive to light.  Cardiovascular:     Rate and Rhythm: Normal rate and regular rhythm.  Pulmonary:     Effort: Pulmonary effort is normal. No respiratory distress.  Neurological:     Mental Status: He is alert and oriented to person, place, and time.  Psychiatric:        Mood and Affect: Mood normal.        Behavior: Behavior normal.        Assessment/Plan: 1. Hypertension secondary to other renal disorders Stable, continue lisinopril and metoprolol as prescribed.   2. Tachycardia Stable, instructed patient to call the clinic if his HR is staying consistently above 100 bpm and/or he is having palpitations.   3. Recurrent UTI Course of bactrim provided just in case patient has a UTI  - sulfamethoxazole-trimethoprim  (BACTRIM DS) 800-160 MG tablet; Take 1 tablet by mouth 2 (two) times daily.  Dispense: 14 tablet; Refill: 0   General Counseling: rendon atondo understanding of the findings of todays visit and agrees with plan of treatment. I have discussed any further diagnostic evaluation that may be needed or ordered today. We also reviewed his medications today. he has been encouraged to call the office with any questions or concerns that should arise related to todays visit.    No orders of the defined types were placed in this encounter.   Meds ordered this encounter  Medications   sulfamethoxazole-trimethoprim (BACTRIM DS) 800-160 MG tablet    Sig: Take 1 tablet by mouth 2 (two) times daily.    Dispense:  14 tablet  Refill:  0    Return for previously scheduled, CPE with lauren PA-C in january. .   Total time spent:30 Minutes Time spent includes review of chart, medications, test results, and follow up plan with the patient.   [place] Controlled Substance Database was reviewed by me.  This patient was seen by Sallyanne Kuster, FNP-C in collaboration with Dr. Beverely Risen as a part of collaborative care agreement.    R. Tedd Sias, MSN, FNP-C Internal medicine

## 2023-12-11 ENCOUNTER — Telehealth: Payer: Self-pay

## 2023-12-11 MED ORDER — CIPROFLOXACIN HCL 500 MG PO TABS: 500.0000 mg | ORAL_TABLET | Freq: Two times a day (BID) | ORAL | 0 refills | Status: DC

## 2023-12-11 NOTE — Telephone Encounter (Signed)
Pt called for that he had UTI as per dr Welton Flakes sent cipro advised him to also call urology

## 2023-12-11 NOTE — Telephone Encounter (Signed)
Pt  called that he is having UTI as per dr Welton Flakes sent med cipro advised him to call urology and also if not feeling better need appt

## 2024-01-05 ENCOUNTER — Encounter: Payer: Self-pay | Admitting: Physician Assistant

## 2024-01-05 ENCOUNTER — Ambulatory Visit (INDEPENDENT_AMBULATORY_CARE_PROVIDER_SITE_OTHER): Payer: Medicaid Other | Admitting: Physician Assistant

## 2024-01-05 VITALS — BP 130/70 | HR 95 | Temp 97.8°F | Resp 16 | Ht 72.0 in | Wt 295.0 lb

## 2024-01-05 DIAGNOSIS — R319 Hematuria, unspecified: Secondary | ICD-10-CM

## 2024-01-05 DIAGNOSIS — Z0001 Encounter for general adult medical examination with abnormal findings: Secondary | ICD-10-CM | POA: Diagnosis not present

## 2024-01-05 DIAGNOSIS — E782 Mixed hyperlipidemia: Secondary | ICD-10-CM

## 2024-01-05 DIAGNOSIS — R000 Tachycardia, unspecified: Secondary | ICD-10-CM

## 2024-01-05 DIAGNOSIS — R300 Dysuria: Secondary | ICD-10-CM

## 2024-01-05 DIAGNOSIS — Z1329 Encounter for screening for other suspected endocrine disorder: Secondary | ICD-10-CM

## 2024-01-05 DIAGNOSIS — Z6841 Body Mass Index (BMI) 40.0 and over, adult: Secondary | ICD-10-CM

## 2024-01-05 DIAGNOSIS — N390 Urinary tract infection, site not specified: Secondary | ICD-10-CM

## 2024-01-05 DIAGNOSIS — I151 Hypertension secondary to other renal disorders: Secondary | ICD-10-CM

## 2024-01-05 LAB — POCT URINALYSIS DIPSTICK
Bilirubin, UA: NEGATIVE
Glucose, UA: NEGATIVE
Ketones, UA: NEGATIVE
Nitrite, UA: NEGATIVE
Protein, UA: POSITIVE — AB
Spec Grav, UA: 1.01 (ref 1.010–1.025)
Urobilinogen, UA: 0.2 U/dL
pH, UA: 5 (ref 5.0–8.0)

## 2024-01-05 MED ORDER — SULFAMETHOXAZOLE-TRIMETHOPRIM 800-160 MG PO TABS: 1.0000 | ORAL_TABLET | Freq: Two times a day (BID) | ORAL | 0 refills | Status: DC

## 2024-01-05 NOTE — Progress Notes (Signed)
[facility] [address] [place], [place] [address]  Internal MEDICINE  Office Visit Note  Patient Name: Austin Stevenson  [id number]  [id number]  Date of Service: 01/05/2024  Chief Complaint  Patient presents with   Annual Exam   Hypertension     HPI Pt is here for routine health maintenance examination -down 13lbs since last visit in our office -lifting some weights and living on his own so not spending money eating out now. Does not like to cook so does eat premade meals, but is lessening portions -Feels like he has a UTI, left flank pain started Sat. He is prone to these. Does see urology and nephrology. Has felt a little fever/chills, none in office today. Thought maybe he could have caught something viral going around but no cough, runny nose, or N/V, only UTI symptoms. -Doing well with his medications, BP and HR improved -due for thyroid and lipid screening, other labs done by nephrology  Current Medication: Outpatient Encounter Medications as of 01/05/2024  Medication Sig   lisinopril (ZESTRIL) 20 MG tablet Take 1 tablet (20 mg total) by mouth daily.   metoprolol succinate (TOPROL-XL) 100 MG 24 hr tablet Take 1 tablet (100 mg total) by mouth daily. Take with or immediately following a meal.   promethazine (PHENERGAN) 12.5 MG tablet Take 12.5 mg by mouth daily as needed.   [DISCONTINUED] ciprofloxacin (CIPRO) 500 MG tablet Take 1 tablet (500 mg total) by mouth 2 (two) times daily.   [DISCONTINUED] sulfamethoxazole-trimethoprim (BACTRIM DS) 800-160 MG tablet Take 1 tablet by mouth 2 (two) times daily.   sulfamethoxazole-trimethoprim (BACTRIM DS) 800-160 MG tablet Take 1 tablet by mouth 2 (two) times daily.   No facility-administered encounter medications on file as of 01/05/2024.    Surgical History: Past Surgical History:  Procedure Laterality Date   ABDOMINAL SURGERY     ABSCESS DRAINAGE  11/15/2005   creation of cathetrizable stoma     cystopic catheter  placement   11/0/2010   CYSTOURETHROSCOPY     dilation of stricture  09/12/2008   EPISPADIUS CORRECTION     ESOPHAGOGASTRODUODENOSCOPY (EGD) WITH PROPOFOL N/A 11/25/2016   Procedure: ESOPHAGOGASTRODUODENOSCOPY (EGD) WITH PROPOFOL;  Surgeon: Midge Minium, MD;  Location: [facility] ENDOSCOPY;  Service: Endoscopy;  Laterality: N/A;   MITROFANOFF PROCEDURE  09/10/2005   SUPRAPUBIC CATHETER PLACEMENT  09/28/2005   TRANSRECTAL DRAINAGE OF PELVIC ABSCESS     URETHRAL DILATION      Medical History: 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 Negative for fatigue and unexpected weight change.  HENT: Negative.  Negative for congestion, postnasal drip, rhinorrhea, sneezing and sore throat.   Eyes:  Negative for redness.  Respiratory: Negative.  Negative for cough, chest tightness, shortness of breath and wheezing.   Cardiovascular: Negative.  Negative for chest pain and palpitations.  Gastrointestinal: Negative.  Negative for abdominal pain, constipation, diarrhea, nausea and vomiting.  Genitourinary:  Positive for flank pain. Negative for dysuria.  Musculoskeletal:  Negative for arthralgias, back pain, joint swelling and neck pain.  Skin:  Negative for rash.  Neurological: Negative.  Negative for tremors and numbness.  Hematological:  Negative for adenopathy. Does not bruise/bleed easily.  Psychiatric/Behavioral:  Negative for behavioral problems (Depression), sleep disturbance and suicidal ideas. The patient is not nervous/anxious.      Vital Signs: BP 130/70   Pulse 95   Temp 97.8 F (36.6 C)   Resp 16   Ht 6' (1.829 m)   Wt 295 lb (133.8 kg)   SpO2 96%   BMI  40.01 kg/m    Physical Exam Vitals and nursing note reviewed.  Constitutional:      General: He is not in acute distress.    Appearance: Normal appearance. He is well-developed. He is obese. He is not diaphoretic.  HENT:     Head: Normocephalic and atraumatic.     Mouth/Throat:     Pharynx: No oropharyngeal exudate.  Eyes:     Pupils: Pupils are equal, round, and reactive to light.  Neck:     Thyroid: No thyromegaly.     Vascular: No JVD.     Trachea: No tracheal deviation.  Cardiovascular:     Rate and Rhythm: Normal rate and regular rhythm.     Heart sounds: Normal heart sounds. No murmur heard.    No friction rub. No gallop.  Pulmonary:     Effort: Pulmonary effort is normal. No respiratory distress.     Breath sounds: No wheezing or rales.  Chest:     Chest wall: No tenderness.  Abdominal:     Palpations: Abdomen is soft.     Tenderness: There is no abdominal tenderness.  Musculoskeletal:        General: Normal range of motion.     Cervical back: Normal range of motion and neck supple.  Lymphadenopathy:     Cervical: No cervical adenopathy.  Skin:    General: Skin is warm and dry.  Neurological:     Mental Status: He is alert and oriented to person, place, and time.     Cranial Nerves: No cranial nerve deficit.  Psychiatric:        Behavior: Behavior normal.        Thought Content: Thought content normal.        Judgment: Judgment normal.      LABS: Recent Results (from the past 2160 hours)  POCT Urinalysis Dipstick     Status: Abnormal   Collection Time: 01/05/24  9:20 AM  Result Value Ref Range   Color, UA     Clarity, UA     Glucose, UA Negative Negative   Bilirubin, UA negative    Ketones, UA negative    Spec Grav, UA 1.010 1.010 - 1.025   Blood, UA Large    pH, UA 5.0 5.0 - 8.0   Protein, UA Positive (A) Negative   Urobilinogen, UA 0.2 0.2 or 1.0 E.U./dL   Nitrite, UA negative    Leukocytes, UA Large (3+) (A) Negative   Appearance     Odor           Assessment/Plan: 1. Encounter for general adult medical examination with abnormal findings (Primary) CPE performed, lab slip given  2. Hypertension secondary to other renal disorders Stable, continue current medication  3. Tachycardia Stable, continue current medication. Will also check thyroid - TSH + free T4  4. Urinary tract infection with hematuria, site unspecified Will tx with bactrim and adjust based on C/S. Should follow up with urology - CULTURE, URINE COMPREHENSIVE - sulfamethoxazole-trimethoprim (BACTRIM DS) 800-160 MG tablet; Take 1 tablet by  mouth 2 (two) times daily.  Dispense: 14 tablet; Refill: 0  5. Dysuria - POCT Urinalysis Dipstick  6. Thyroid disorder screen - TSH + free T4  7. Mixed hyperlipidemia - Lipid Panel With LDL/HDL Ratio  8. Morbid obesity with BMI of 40.0-44.9, adult (HCC) Down 13lbs since last visit and encouraged to continue to work on this   General Counseling: Madelyn verbalizes understanding of the findings of todays visit and agrees with plan of treatment. I have discussed any further diagnostic evaluation that may be needed or ordered today. We also reviewed his medications today. he has been encouraged to call the office with any questions or concerns that should arise related to todays visit.    Counseling:    Orders Placed This Encounter  Procedures   CULTURE, URINE COMPREHENSIVE   TSH + free T4   Lipid Panel With LDL/HDL Ratio   POCT Urinalysis Dipstick    Meds ordered this encounter  Medications   sulfamethoxazole-trimethoprim (BACTRIM DS) 800-160 MG tablet    Sig: Take 1 tablet by mouth 2 (two) times daily.    Dispense:  14 tablet    Refill:  0    This patient was seen by Lynn Ito, PA-C in collaboration with Dr. Beverely Risen as a part of collaborative care agreement.  Total time spent:35 Minutes  Time spent includes review of chart, medications, test results, and follow up plan with the patient.      Lyndon Code, MD  Internal Medicine

## 2024-01-08 LAB — CULTURE, URINE COMPREHENSIVE

## 2024-01-09 ENCOUNTER — Telehealth: Payer: Self-pay

## 2024-01-09 NOTE — Telephone Encounter (Signed)
Left message for patient regarding urine culture results.

## 2024-01-09 NOTE — Telephone Encounter (Signed)
-----   Message from Carlean Jews sent at 01/08/2024  6:04 PM EST ----- Please let him know he is on appropriate ABX and should complete course

## 2024-05-31 ENCOUNTER — Telehealth: Payer: Self-pay | Admitting: Physician Assistant

## 2024-05-31 NOTE — Telephone Encounter (Signed)
 Patient called requesting sick visit for possible uti. Only available appointment is Wednesday 06/02/24. Patient asked if we could just call in an antibiotic. I explained we need to collect urine sample to confirm it is a uti and not something. I suggested he go to urgent care if he was unable to wait until Wednesday-Toni

## 2024-06-11 ENCOUNTER — Inpatient Hospital Stay
Admission: EM | Admit: 2024-06-11 | Discharge: 2024-06-14 | DRG: 698 | Disposition: A | Discharge status: Home / Self Care | Source: Ambulatory Visit | Attending: Family Medicine | Admitting: Family Medicine

## 2024-06-11 ENCOUNTER — Emergency Department

## 2024-06-11 ENCOUNTER — Encounter: Payer: Self-pay | Admitting: Family Medicine

## 2024-06-11 ENCOUNTER — Other Ambulatory Visit: Payer: Self-pay

## 2024-06-11 DIAGNOSIS — N2889 Other specified disorders of kidney and ureter: Secondary | ICD-10-CM | POA: Diagnosis present

## 2024-06-11 DIAGNOSIS — N400 Enlarged prostate without lower urinary tract symptoms: Secondary | ICD-10-CM | POA: Diagnosis present

## 2024-06-11 DIAGNOSIS — T83510A Infection and inflammatory reaction due to cystostomy catheter, initial encounter: Secondary | ICD-10-CM | POA: Diagnosis present

## 2024-06-11 DIAGNOSIS — R6521 Severe sepsis with septic shock: Secondary | ICD-10-CM | POA: Diagnosis present

## 2024-06-11 DIAGNOSIS — Z1623 Resistance to quinolones and fluoroquinolones: Secondary | ICD-10-CM | POA: Diagnosis present

## 2024-06-11 DIAGNOSIS — A419 Sepsis, unspecified organism: Secondary | ICD-10-CM | POA: Diagnosis present

## 2024-06-11 DIAGNOSIS — N136 Pyonephrosis: Secondary | ICD-10-CM | POA: Diagnosis present

## 2024-06-11 DIAGNOSIS — Z833 Family history of diabetes mellitus: Secondary | ICD-10-CM | POA: Diagnosis not present

## 2024-06-11 DIAGNOSIS — Z6838 Body mass index (BMI) 38.0-38.9, adult: Secondary | ICD-10-CM

## 2024-06-11 DIAGNOSIS — N1831 Chronic kidney disease, stage 3a: Secondary | ICD-10-CM | POA: Diagnosis present

## 2024-06-11 DIAGNOSIS — E871 Hypo-osmolality and hyponatremia: Secondary | ICD-10-CM | POA: Diagnosis present

## 2024-06-11 DIAGNOSIS — Z79899 Other long term (current) drug therapy: Secondary | ICD-10-CM

## 2024-06-11 DIAGNOSIS — Z8249 Family history of ischemic heart disease and other diseases of the circulatory system: Secondary | ICD-10-CM | POA: Diagnosis not present

## 2024-06-11 DIAGNOSIS — Z87442 Personal history of urinary calculi: Secondary | ICD-10-CM | POA: Diagnosis not present

## 2024-06-11 DIAGNOSIS — I959 Hypotension, unspecified: Secondary | ICD-10-CM | POA: Diagnosis present

## 2024-06-11 DIAGNOSIS — N281 Cyst of kidney, acquired: Secondary | ICD-10-CM | POA: Diagnosis present

## 2024-06-11 DIAGNOSIS — A4151 Sepsis due to Escherichia coli [E. coli]: Secondary | ICD-10-CM | POA: Diagnosis present

## 2024-06-11 DIAGNOSIS — I129 Hypertensive chronic kidney disease with stage 1 through stage 4 chronic kidney disease, or unspecified chronic kidney disease: Secondary | ICD-10-CM | POA: Diagnosis present

## 2024-06-11 DIAGNOSIS — R161 Splenomegaly, not elsewhere classified: Secondary | ICD-10-CM | POA: Diagnosis present

## 2024-06-11 DIAGNOSIS — F17290 Nicotine dependence, other tobacco product, uncomplicated: Secondary | ICD-10-CM | POA: Diagnosis present

## 2024-06-11 DIAGNOSIS — Z87440 Personal history of urinary (tract) infections: Secondary | ICD-10-CM

## 2024-06-11 DIAGNOSIS — N179 Acute kidney failure, unspecified: Secondary | ICD-10-CM | POA: Diagnosis present

## 2024-06-11 DIAGNOSIS — N12 Tubulo-interstitial nephritis, not specified as acute or chronic: Principal | ICD-10-CM

## 2024-06-11 LAB — CBC WITH DIFFERENTIAL/PLATELET
Abs Immature Granulocytes: 0.15 10*3/uL — ABNORMAL HIGH (ref 0.00–0.07)
Basophils Absolute: 0.1 10*3/uL (ref 0.0–0.1)
Basophils Relative: 0 %
Eosinophils Absolute: 0 10*3/uL (ref 0.0–0.5)
Eosinophils Relative: 0 %
HCT: 45.1 % (ref 39.0–52.0)
Hemoglobin: 14.6 g/dL (ref 13.0–17.0)
Immature Granulocytes: 1 %
Lymphocytes Relative: 10 %
Lymphs Abs: 1.9 10*3/uL (ref 0.7–4.0)
MCH: 26.4 pg (ref 26.0–34.0)
MCHC: 32.4 g/dL (ref 30.0–36.0)
MCV: 81.6 fL (ref 80.0–100.0)
Monocytes Absolute: 1.7 10*3/uL — ABNORMAL HIGH (ref 0.1–1.0)
Monocytes Relative: 9 %
Neutro Abs: 16.1 10*3/uL — ABNORMAL HIGH (ref 1.7–7.7)
Neutrophils Relative %: 80 %
Platelets: 307 10*3/uL (ref 150–400)
RBC: 5.53 MIL/uL (ref 4.22–5.81)
RDW: 14 % (ref 11.5–15.5)
WBC: 19.9 10*3/uL — ABNORMAL HIGH (ref 4.0–10.5)
nRBC: 0 % (ref 0.0–0.2)

## 2024-06-11 LAB — URINALYSIS, ROUTINE W REFLEX MICROSCOPIC
Bilirubin Urine: NEGATIVE
Glucose, UA: NEGATIVE mg/dL
Ketones, ur: NEGATIVE mg/dL
Nitrite: NEGATIVE
Protein, ur: >=300 mg/dL — AB
RBC / HPF: >50 RBC/hpf (ref 0–5)
Specific Gravity, Urine: 1.015 (ref 1.005–1.030)
Squamous Epithelial / HPF: 0 /HPF (ref 0–5)
WBC, UA: >50 WBC/hpf (ref 0–5)
pH: 5 (ref 5.0–8.0)

## 2024-06-11 LAB — BASIC METABOLIC PANEL WITH GFR
Anion gap: 8 (ref 5–15)
BUN: 32 mg/dL — ABNORMAL HIGH (ref 6–20)
CO2: 21 mmol/L — ABNORMAL LOW (ref 22–32)
Calcium: 9.2 mg/dL (ref 8.9–10.3)
Chloride: 101 mmol/L (ref 98–111)
Creatinine, Ser: 2.84 mg/dL — ABNORMAL HIGH (ref 0.61–1.24)
GFR, Estimated: 31 mL/min — ABNORMAL LOW (ref >60)
Glucose, Bld: 91 mg/dL (ref 70–99)
Potassium: 4 mmol/L (ref 3.5–5.1)
Sodium: 130 mmol/L — ABNORMAL LOW (ref 135–145)

## 2024-06-11 LAB — LACTIC ACID, PLASMA
Lactic Acid, Venous: 1.2 mmol/L (ref 0.5–1.9)
Lactic Acid, Venous: 0.8 mmol/L (ref 0.5–1.9)

## 2024-06-11 LAB — HEMOGLOBIN A1C
Hgb A1c MFr Bld: 5.2 % (ref 4.8–5.6)
Mean Plasma Glucose: 102.54 mg/dL

## 2024-06-11 MED ORDER — CARMEX CLASSIC LIP BALM EX OINT: 1.0000 | TOPICAL_OINTMENT | CUTANEOUS | Status: DC | PRN

## 2024-06-11 MED ORDER — SODIUM CHLORIDE 0.9 % IV SOLN
1.0000 g | Freq: Once | INTRAVENOUS | Status: AC
  Administered 2024-06-11: 1 g via INTRAVENOUS
  Filled 2024-06-11: qty 10

## 2024-06-11 MED ORDER — HYDROCODONE-ACETAMINOPHEN 5-325 MG PO TABS: 1.0000 | ORAL_TABLET | Freq: Four times a day (QID) | ORAL | Status: DC | PRN

## 2024-06-11 MED ORDER — SODIUM CHLORIDE 0.9 % IV SOLN: INTRAVENOUS | Status: AC

## 2024-06-11 MED ORDER — HYDRALAZINE HCL 20 MG/ML IJ SOLN: 10.0000 mg | Freq: Four times a day (QID) | INTRAMUSCULAR | Status: DC | PRN

## 2024-06-11 MED ORDER — MUSCLE RUB 10-15 % EX CREA: 1.0000 | TOPICAL_CREAM | CUTANEOUS | Status: DC | PRN

## 2024-06-11 MED ORDER — SALINE SPRAY 0.65 % NA SOLN: 1.0000 | NASAL | Status: DC | PRN

## 2024-06-11 MED ORDER — BLISTEX MEDICATED EX OINT: TOPICAL_OINTMENT | CUTANEOUS | Status: DC | PRN

## 2024-06-11 MED ORDER — ACETAMINOPHEN 325 MG PO TABS
650.0000 mg | ORAL_TABLET | Freq: Four times a day (QID) | ORAL | Status: DC | PRN
  Administered 2024-06-11 – 2024-06-12 (×5): 650 mg via ORAL
  Filled 2024-06-11 (×5): qty 2

## 2024-06-11 MED ORDER — LACTATED RINGERS IV BOLUS
1000.0000 mL | Freq: Once | INTRAVENOUS | Status: AC
  Administered 2024-06-11: 1000 mL via INTRAVENOUS

## 2024-06-11 MED ORDER — SODIUM CHLORIDE 0.9 % IV SOLN
2.0000 g | Freq: Two times a day (BID) | INTRAVENOUS | Status: DC
  Administered 2024-06-11 – 2024-06-14 (×6): 2 g via INTRAVENOUS
  Filled 2024-06-11 (×7): qty 12.5

## 2024-06-11 MED ORDER — LABETALOL HCL 5 MG/ML IV SOLN: 10.0000 mg | INTRAVENOUS | Status: DC | PRN

## 2024-06-11 MED ORDER — MORPHINE SULFATE (PF) 2 MG/ML IV SOLN: 2.0000 mg | INTRAVENOUS | Status: DC | PRN

## 2024-06-11 MED ORDER — HEPARIN SODIUM (PORCINE) 5000 UNIT/ML IJ SOLN
5000.0000 [IU] | Freq: Three times a day (TID) | INTRAMUSCULAR | Status: DC
  Administered 2024-06-11 – 2024-06-14 (×8): 5000 [IU] via SUBCUTANEOUS
  Filled 2024-06-11 (×8): qty 1

## 2024-06-11 MED ORDER — ACETAMINOPHEN 500 MG PO TABS
1000.0000 mg | ORAL_TABLET | Freq: Once | ORAL | Status: AC
  Administered 2024-06-11: 1000 mg via ORAL
  Filled 2024-06-11: qty 2

## 2024-06-11 NOTE — H&P (Addendum)
 History and Physical    Patient: Austin Stevenson FMW:[id number] DOB: [birth year] DOA: 06/11/2024 DOS: the patient was seen and examined on 06/11/2024 PCP: Kristina Tinnie POUR, PA-C  Patient coming from: Home  Chief Complaint:  Chief Complaint  Patient presents with   Hypotension   HPI: Austin Stevenson is a 28 y.o. 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  He was recently diagnosed with UTI outpatient and completed a course of Macrobid and ciprofloxacin  4 days prior.  He reports he has had decreased urinary output since that time. Reports last night began having fevers, nausea, vomiting, left sided flank pain, headache, rhinorrhea. Endorses dark colored urine.   In the ED patient is found to be septic, temperature 101.2, blood pressure 91/46, tachycardia to 124.  Labs reveal leukocytosis of 19.9, sodium 130, creatinine 2.84.  Patient was given 1 g ceftriaxone, 2 L LR.  Urine and blood culture sent. CT renal stone: Moderate left-sided hydroureteronephrosis with nonspecific left perinephric stranding, left upper pole 2.2 cm fluid density cyst versus calyceal diverticulum.  Markedly atrophic right kidney mild to moderate prominence of right ureter.  Right-sided suprapubic catheter in place, distended bladder.  No abnormal fluid extending along tract of suprapubic catheter.  Mildly prominent left retroperitoneal lymph nodes posterior to left renal vein.  Enlarged prostate up to 5.4 cm.  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  Review of Systems: As mentioned in the history of present illness. All other systems reviewed and are negative. 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    Urethral  fistula    Past Surgical History:  Procedure Laterality Date   ABDOMINAL SURGERY     ABSCESS DRAINAGE  11/15/2005   creation of cathetrizable stoma     cystopic catheter placement   11/0/2010   CYSTOURETHROSCOPY     dilation of stricture  09/12/2008   EPISPADIUS CORRECTION     ESOPHAGOGASTRODUODENOSCOPY (EGD) WITH PROPOFOL  N/A 11/25/2016   Procedure: ESOPHAGOGASTRODUODENOSCOPY (EGD) WITH PROPOFOL ;  Surgeon: Rogelia Copping, MD;  Location: [facility] ENDOSCOPY;  Service: Endoscopy;  Laterality: N/A;   MITROFANOFF PROCEDURE  09/10/2005   SUPRAPUBIC CATHETER PLACEMENT  09/28/2005   TRANSRECTAL DRAINAGE OF PELVIC ABSCESS     URETHRAL DILATION     Social History:  reports that he has been smoking e-cigarettes. He has never used smokeless tobacco. He reports that he does not drink alcohol and does not use drugs.  No Known Allergies  Family History  Problem Relation Age of Onset   Diabetes Mother    Hypertension Paternal Grandfather     Prior to Admission medications   Medication Sig Start Date End Date Taking? Authorizing Provider  lisinopril  (ZESTRIL ) 20 MG tablet Take 1 tablet (20 mg total) by mouth daily. 03/13/23   McDonough, Tinnie POUR, PA-C  metoprolol  succinate (TOPROL -XL) 100 MG 24 hr tablet Take 1 tablet (100 mg total) by mouth daily. Take with or immediately following a meal. 03/13/23   McDonough, Lauren K, PA-C  promethazine  (PHENERGAN ) 12.5 MG tablet Take 12.5 mg by mouth daily as needed. 01/27/23   [provider]  sulfamethoxazole -trimethoprim  (BACTRIM  DS) 800-160 MG tablet Take 1 tablet by mouth 2 (two) times daily. 01/05/24   McDonough,  Tinnie POUR, PA-C    Physical Exam: Vitals:   06/11/24 1548 06/11/24 1600 06/11/24 1630 06/11/24 1721  BP:  104/72 (!) 110/55   Pulse:  (!) 117 (!) 113   Resp:  15 18   Temp: 99.9 F (37.7 C)   (!) 101.2 F (38.4 C)  TempSrc: Oral   Oral  SpO2:  100% 99%   Weight:      Height:       Constitutional:  Normal appearance. Non  toxic-appearing.  HENT: Head Normocephalic and atraumatic.  Mucous membranes are moist.  Eyes:  Extraocular intact. Conjunctivae normal. Pupils are equal, round, and reactive to light.  Cardiovascular: Rate and Rhythm: Normal rate and regular rhythm.  Pulmonary: Non labored, symmetric rise of chest wall. Abdomen: Soft, nontender, nondistended.  Midline surgical scar.  Suprapubic button in place.  Mild left-sided flank pain Musculoskeletal:  Normal range of motion.  Skin: warm and dry. not jaundiced.  Neurological: No focal deficit present. alert. Oriented. Psychiatric: Mood and Affect congruent.   Data Reviewed:    Latest Ref Rng & Units 06/11/2024    3:32 PM 06/15/2023    9:42 AM 02/25/2023    7:02 AM  CBC  WBC 4.0 - 10.5 K/uL 19.9  6.4  11.2   Hemoglobin 13.0 - 17.0 g/dL 85.3  85.6  87.9   Hematocrit 39.0 - 52.0 % 45.1  43.5  36.5   Platelets 150 - 400 K/uL 307  235  166       Latest Ref Rng & Units 06/11/2024    3:32 PM 06/15/2023    9:42 AM 02/25/2023    7:02 AM  BMP  Glucose 70 - 99 mg/dL 91  889  90   BUN 6 - 20 mg/dL 32  19  23   Creatinine 0.61 - 1.24 mg/dL 7.15  8.70  8.31   Sodium 135 - 145 mmol/L 130  135  133   Potassium 3.5 - 5.1 mmol/L 4.0  4.0  3.7   Chloride 98 - 111 mmol/L 101  111  108   CO2 22 - 32 mmol/L 21  20  19    Calcium 8.9 - 10.3 mg/dL 9.2  8.7  8.4      Assessment and Plan: No notes have been filed under this hospital service. Service: Hospitalist  Sepsis with septic shock, secondary to pyelonephritis, with associated AKI  multiple prior UTIs - Sepsis criteria: Leukocytosis, tachycardia, fever - Patiently recently completed course of Macrobid and ciprofloxacin .  Upon review of prior cultures patient has grown E. coli sensitive to ceftriaxone in the past.  Received 1 g ceftriaxone in the ED. - For now we will broaden to cefepime, high risk Pseudomonas given indwelling catheter and history of recurrent intermittent catheterization.  Will narrow per  culture results - Hemoglobin A1c ordered and pending - Follow urine cultures, follow blood cultures - Status post 2 L LR in ED.  Will start on maintenance IV fluids  AKI superimposed on CKD stage IIIa Atrophic right kidney - Baseline creatinine appears to be near 1.5.  Creatinine 2.84 on arrival - Status post 2 L LR - Continue maintenance IV fluids - Follows with [facility] DR Dominica.  Will consult in-house nephrology in a.m. - Strict I's and O's  Hyponatremia - Sodium 130 on arrival.  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  At home takes metoprolol  and lisinopril .  Hold both for now  BMI 38 Morbid obesity - Outpatient follow up for lifestyle modification and risk factor management  Enlarged prostate - Incidentally seen on CT, outpatient follow-up  Splenomegaly - Incidentally seen on CT.  Platelet function within normal limits.  Will trend CBC   Advance Care Planning: Patient desires to be full code  Consults: Nephrology consult in AM  Family Communication: none present at bedside, spoke directly with patient  Severity of Illness: The appropriate patient status for this patient is INPATIENT. Inpatient status is judged to be reasonable and necessary in order to provide the required intensity of service to ensure the patient's safety. The patient's presenting symptoms, physical exam findings, and initial radiographic and laboratory data in the context of their chronic comorbidities is felt to place them at high risk for further clinical deterioration. Furthermore, it is not anticipated that the patient will be medically stable for discharge from the hospital  within 2 midnights of admission.   * I certify that at the point of admission it is my clinical judgment that the patient will require inpatient hospital care spanning beyond 2 midnights from the point of admission due to high intensity of service, high risk for further deterioration and high frequency of surveillance required.*  Author: Saylor Sheckler, DO 06/11/2024 5:38 PM  For on call review [email or web address].

## 2024-06-11 NOTE — ED Provider Notes (Signed)
 [facility] Provider Note    Event Date/Time   First MD Initiated Contact with Patient 06/11/24 1509     (approximate)   History   Hypotension   HPI  Austin Stevenson is a 28 y.o. male  who presents to the emergency department today because of concern for change in urination, chills, nausea. Patient was recently treated for UTI and finished course of macrobid and cipro  4 days ago. He was feeling improved when the antibiotics finished however now is feeling bad again. Has suprapubic catheter and has had infections in the past. Also history of kidney stones.     Physical Exam   Triage Vital Signs: ED Triage Vitals  Encounter Vitals Group     BP 06/11/24 1420 (!) 101/54     Girls Systolic BP Percentile --      Girls Diastolic BP Percentile --      Boys Systolic BP Percentile --      Boys Diastolic BP Percentile --      Pulse Rate 06/11/24 1420 (!) 124     Resp 06/11/24 1420 16     Temp 06/11/24 1420 98.5 F (36.9 C)     Temp Source 06/11/24 1420 Oral     SpO2 06/11/24 1420 100 %     Weight 06/11/24 1420 294 lb 15.6 oz (133.8 kg)     Height 06/11/24 1548 6' (1.829 m)     Head Circumference --      Peak Flow --      Pain Score 06/11/24 1420 0     Pain Loc --      Pain Education --      Exclude from Growth Chart --     Most recent vital signs: Vitals:   06/11/24 1530 06/11/24 1548  BP: 117/66   Pulse: (!) 121   Resp:    Temp:  99.9 F (37.7 C)  SpO2: 100%    General: Awake, alert, oriented. CV:  Good peripheral perfusion. Tachycardia. Resp:  Normal effort. Lungs clear. Abd:  No distention.    ED Results / Procedures / Treatments   Labs (all labs ordered are listed, but only abnormal results are displayed) Labs Reviewed  URINALYSIS, ROUTINE W REFLEX MICROSCOPIC - Abnormal; Notable for the following components:      Result Value   Color, Urine AMBER (*)    APPearance CLOUDY (*)    Hgb urine dipstick MODERATE (*)    Protein, ur  >=300 (*)    Leukocytes,Ua LARGE (*)    Bacteria, UA MANY (*)    All other components within normal limits  CBC WITH DIFFERENTIAL/PLATELET - Abnormal; Notable for the following components:   WBC 19.9 (*)    Neutro Abs 16.1 (*)    Monocytes Absolute 1.7 (*)    Abs Immature Granulocytes 0.15 (*)    All other components within normal limits  BASIC METABOLIC PANEL WITH GFR - Abnormal; Notable for the following components:   Sodium 130 (*)    CO2 21 (*)    BUN 32 (*)    Creatinine, Ser 2.84 (*)    GFR, Estimated 31 (*)    All other components within normal limits  URINE CULTURE  CULTURE, BLOOD (ROUTINE X 2)  CULTURE, BLOOD (ROUTINE X 2)  LACTIC ACID, PLASMA  LACTIC ACID, PLASMA  HIV ANTIBODY (ROUTINE TESTING W REFLEX)  COMPREHENSIVE METABOLIC PANEL WITH GFR  CBC  MAGNESIUM  PHOSPHORUS  HEMOGLOBIN A1C     EKG  None   RADIOLOGY I independently interpreted and visualized the CT renal. My interpretation: left sided pyelonephritis, no ureteral stone Radiology interpretation:  IMPRESSION:  1. Moderate left-sided hydroureteronephrosis with nonspecific left  perinephric stranding. No left-sided renal or ureteral calculi.  These findings may be secondary to infection, a stone having  recently passed, or chronic obstruction.  2. Markedly atrophic right kidney. Mild-to-moderate prominence of  the right ureter. No right renal or ureteral calculi.  3. Decreased size of a tubular fluid density structure extending  from the junction of the anterior superior right urinary bladder  wall, just superior to the tract of the current suprapubic catheter,  into the right anterior abdominal wall. This measures up to 1.6 cm  in thickness on today's exam, previously 3 cm. 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  4. Increased size of prominent left retroperitoneal lymph nodes  measuring up to 9 mm, which may be reactive. Slightly decreased size  of a mildly prominent left external iliac lymph node.  5. Enlarged prostate.  6. Splenomegaly.      PROCEDURES:  Critical Care performed: Yes  CRITICAL CARE Performed by: Guadalupe Eagles   Total critical care time: 30 minutes  Critical care time was exclusive of separately billable procedures and treating other patients.  Critical care was necessary to treat or prevent imminent or life-threatening deterioration.  Critical care was time spent personally by me on the following activities: development of treatment plan with patient and/or surrogate as well as nursing, discussions with consultants, evaluation of patient's response to treatment, examination of patient, obtaining history from patient or surrogate, ordering and performing treatments and interventions, ordering and review of laboratory studies, ordering and review of radiographic studies, pulse oximetry and re-evaluation of patient's condition.   Procedures    MEDICATIONS ORDERED IN ED: Medications  cefTRIAXone (ROCEPHIN) 1 g in sodium chloride  0.9 % 100 mL IVPB (1 g Intravenous New Bag/Given 06/11/24 1547)  lactated ringers bolus 1,000 mL (1,000 mLs Intravenous New Bag/Given 06/11/24 1546)  acetaminophen  (TYLENOL ) tablet 1,000 mg (1,000 mg Oral Given 06/11/24 1552)     IMPRESSION / MDM / ASSESSMENT AND PLAN / ED COURSE  I reviewed the triage vital signs and the nursing notes.                              Differential diagnosis includes, but is not limited to, UTI, pyelonephritis, ureteral stone, diverticulitis  Patient's presentation is most consistent with acute presentation with potential threat to life or bodily function.   The patient is on the cardiac monitor to evaluate for evidence of arrhythmia and/or significant heart rate changes.  Patient  presented to the emergency department today because of concerns for recurrent UTI and left flank pain.  Patient recently finished course of antibiotics.  Neri trifecta.  Patient was tachycardic in the emergency department.  Blood work was concerning for leukocytosis.  Urine is consistent with infection.  Given left flank pain CT renal was obtained to evaluate for possible ureteral stone.  This did not show ureteral stone but is consistent with pyelonephritis.  Discussed with Dr. Dezii with the hospitalist service who will evaluate for admission.      FINAL CLINICAL IMPRESSION(S) / ED DIAGNOSES  Final diagnoses:  Pyelonephritis      Note:  This document was prepared using Dragon voice recognition software and may include unintentional dictation errors.    Floy Roberts, MD 06/11/24 [phone]

## 2024-06-11 NOTE — ED Triage Notes (Addendum)
 Referred to ED from [facility] for C/O hypotension. Per report, BP 78/46, 90/56 HR 125.  Recent UTI. Has completed Macrobid RX on MOnday. STates had left sided flank and low back pain initially, which had improved, but returned last evening.  Tylenol  1gm taken at 0600 and 500 mg at 1130  AAOx3.  Skin warm and dry. NAD  Patient with suprapubic catheter in place

## 2024-06-11 NOTE — ED Notes (Signed)
 Patient to CT.

## 2024-06-11 NOTE — Consult Note (Signed)
 Pharmacy Antibiotic Note  Austin Stevenson is a 28 y.o. 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  Pharmacy has been consulted for cefepime dosing.  Plan:  Cefepime 2 g IV q12h  Height: 6' (182.9 cm) Weight: 128.4 kg (283 lb) IBW/kg (Calculated) : 77.6  Temp (24hrs), Avg:99.9 F (37.7 C), Min:98.5 F (36.9 C), Max:101.2 F (38.4 C)  Recent Labs  Lab 06/11/24 1532 06/11/24 1720  WBC 19.9*  --   CREATININE 2.84*  --   LATICACIDVEN 1.2 0.8    Estimated Creatinine Clearance: 55.1 mL/min (A) (by C-G formula based on SCr of 2.84 mg/dL (H)).    No Known Allergies  Antimicrobials this admission: Ceftriaxone 6/27 x 1 Cefepime 6/27 >>   Dose adjustments this admission: N/A  Microbiology results: 6/27 BCx: pending 6/27 UCx: pending   Thank you for allowing pharmacy to be a part of this patient's care.  Marolyn KATHEE Mare 06/11/2024 6:17 PM

## 2024-06-12 DIAGNOSIS — R6521 Severe sepsis with septic shock: Secondary | ICD-10-CM | POA: Diagnosis not present

## 2024-06-12 DIAGNOSIS — A419 Sepsis, unspecified organism: Secondary | ICD-10-CM | POA: Diagnosis not present

## 2024-06-12 DIAGNOSIS — N179 Acute kidney failure, unspecified: Secondary | ICD-10-CM | POA: Diagnosis not present

## 2024-06-12 LAB — PHOSPHORUS: Phosphorus: 3.5 mg/dL (ref 2.5–4.6)

## 2024-06-12 LAB — MAGNESIUM: Magnesium: 2.1 mg/dL (ref 1.7–2.4)

## 2024-06-12 LAB — CBC
HCT: 42 % (ref 39.0–52.0)
Hemoglobin: 13.5 g/dL (ref 13.0–17.0)
MCH: 26.6 pg (ref 26.0–34.0)
MCHC: 32.1 g/dL (ref 30.0–36.0)
MCV: 82.7 fL (ref 80.0–100.0)
Platelets: 236 10*3/uL (ref 150–400)
RBC: 5.08 MIL/uL (ref 4.22–5.81)
RDW: 14.1 % (ref 11.5–15.5)
WBC: 17.1 10*3/uL — ABNORMAL HIGH (ref 4.0–10.5)
nRBC: 0 % (ref 0.0–0.2)

## 2024-06-12 LAB — COMPREHENSIVE METABOLIC PANEL WITH GFR
ALT: 28 U/L (ref 0–44)
AST: 22 U/L (ref 15–41)
Albumin: 3.4 g/dL — ABNORMAL LOW (ref 3.5–5.0)
Alkaline Phosphatase: 62 U/L (ref 38–126)
Anion gap: 8 (ref 5–15)
BUN: 27 mg/dL — ABNORMAL HIGH (ref 6–20)
CO2: 17 mmol/L — ABNORMAL LOW (ref 22–32)
Calcium: 8.6 mg/dL — ABNORMAL LOW (ref 8.9–10.3)
Chloride: 108 mmol/L (ref 98–111)
Creatinine, Ser: 2.23 mg/dL — ABNORMAL HIGH (ref 0.61–1.24)
GFR, Estimated: 41 mL/min — ABNORMAL LOW (ref >60)
Glucose, Bld: 81 mg/dL (ref 70–99)
Potassium: 4.1 mmol/L (ref 3.5–5.1)
Sodium: 133 mmol/L — ABNORMAL LOW (ref 135–145)
Total Bilirubin: 1.7 mg/dL — ABNORMAL HIGH (ref 0.0–1.2)
Total Protein: 7.7 g/dL (ref 6.5–8.1)

## 2024-06-12 LAB — HIV ANTIBODY (ROUTINE TESTING W REFLEX): HIV Screen 4th Generation wRfx: NONREACTIVE

## 2024-06-12 MED ORDER — METOPROLOL SUCCINATE ER 100 MG PO TB24
100.0000 mg | ORAL_TABLET | Freq: Every day | ORAL | Status: DC
  Administered 2024-06-12 – 2024-06-14 (×3): 100 mg via ORAL
  Filled 2024-06-12 (×3): qty 1

## 2024-06-12 NOTE — Progress Notes (Signed)
 PROGRESS NOTE    Austin Stevenson  FMW:[id number] DOB: 05/15/1996 DOA: 06/11/2024 PCP: Kristina Tinnie POUR, PA-C  Chief Complaint  Patient presents with   Hypotension    Hospital Course:   Austin Stevenson is a 27 y.o. 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  He was recently diagnosed with UTI outpatient and completed a course of Macrobid and ciprofloxacin  4 days prior.  He reports he has had decreased urinary output since that time. Reports last night began having fevers, nausea, vomiting, left sided flank pain, headache, rhinorrhea. Endorses dark colored urine.  In the ED patient is found to be septic, temperature 101.2, blood pressure 91/46, tachycardia to 124.  Labs reveal leukocytosis of 19.9, sodium 130, creatinine 2.84.  Patient was given 1 g ceftriaxone, 2 L LR.  Urine and blood culture sent. CT renal stone: Moderate left-sided hydroureteronephrosis with nonspecific left perinephric stranding, left upper pole 2.2 cm fluid density cyst versus calyceal diverticulum.  Markedly atrophic right kidney mild to moderate prominence of right ureter.  Right-sided suprapubic catheter in place, distended bladder.  No abnormal fluid extending along tract of suprapubic catheter.  Mildly prominent left retroperitoneal lymph nodes posterior to left renal vein.  Enlarged prostate up to 5.4 cm.  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  Subjective: No issues overnight.  Patient reports he is feeling much better this morning.  Pulse rate slowly rising throughout the day today, he would like to resume his metoprolol  now.   Objective: Vitals:   06/12/24 0349 06/12/24 0733 06/12/24 1146 06/12/24 1505  BP: 124/64 (!) 113/59 (!) 108/58 (!) 122/59  Pulse: (!) 112 95  (!) 119  Resp: 20 17 16 18   Temp: 98.1 F (36.7 C)  98.7 F (37.1 C) 98.7 F (37.1 C) 100.2 F (37.9 C)  TempSrc: Oral Oral Oral Oral  SpO2: 98% 99% 98% 100%  Weight:      Height:        Intake/Output Summary (Last 24 hours) at 06/12/2024 1626 Last data filed at 06/12/2024 1006 Gross per 24 hour  Intake 1096.69 ml  Output 750 ml  Net 346.69 ml   Filed Weights   06/11/24 1420 06/11/24 1548  Weight: 133.8 kg 128.4 kg    Examination: General exam: Appears calm and comfortable, NAD  Respiratory system: No work of breathing, symmetric chest wall expansion Cardiovascular system: S1 & S2 heard, RRR.  Gastrointestinal system: Abdomen is nondistended, soft and nontender.  Neuro: Alert and oriented. No focal neurological deficits. Extremities: Symmetric, expected ROM Skin: No rashes, lesions Psychiatry: Demonstrates appropriate judgement and insight. Mood & affect appropriate for situation.   Assessment & Plan:  Principal Problem:   Sepsis (HCC)    Sepsis with septic shock, secondary to pyelonephritis, with associated AKI  multiple prior UTIs - Sepsis criteria: Leukocytosis, tachycardia, fever - Patiently recently completed course of Macrobid and ciprofloxacin .  Upon review of prior cultures patient has grown E. coli sensitive to ceftriaxone in the past.  Received 1 g ceftriaxone in the ED. - For now we will broaden to cefepime, high risk Pseudomonas given indwelling catheter and history of recurrent intermittent catheterization.  Will narrow per culture results - Hemoglobin A1c 5.2% - Urine and blood culture still pending.  No growth for now - Status post fluid resuscitation in ED.  Continue with maintenance IV fluids.   AKI superimposed  on CKD stage IIIa Atrophic right kidney - Baseline creatinine appears to be near 1.5.  Creatinine 2.84 on arrival - Continue fluid resuscitation - Creatinine downtrending now.  Will continue to monitor closely - Consult nephrology if creatinine is not improving - Follows with [facility] DR  Dominica. - Continue to follow strict I's and O's.  None recorded overnight.  Have discussed with RN  Hyponatremia - Sodium 130 on arrival.  Status post 2 L LR - Now maintenance IV fluids with NS.  Improving.   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  Continue holding home lisinopril  - Increasing tachycardia today.  Will resume home dose metoprolol  for now.  Monitor blood pressure closely.  Titrate meds as needed  BMI 38 Morbid obesity - Outpatient follow up for lifestyle modification and risk factor management   Enlarged prostate - Incidentally seen on CT, outpatient follow-up   Splenomegaly - Incidentally seen on CT.  Platelet function within normal limits.  Will trend CBC  DVT prophylaxis: Heparin   Code Status: Full Code Disposition: Inpatient pending clinical resolution, currently on IV antibiotics  Consultants:    Procedures:    Antimicrobials:  Anti-infectives (From admission, onward)    Start     Dose/Rate Route Frequency Ordered Stop   06/11/24 2000  ceFEPIme (MAXIPIME) 2 g in sodium chloride  0.9 % 100 mL IVPB        2 g 200 mL/hr over 30 Minutes Intravenous 2 times daily 06/11/24 1817     06/11/24 1530  cefTRIAXone (ROCEPHIN) 1 g in sodium chloride  0.9 % 100 mL IVPB        1 g 200 mL/hr over 30 Minutes Intravenous  Once 06/11/24 1522 06/11/24 1627       Data Reviewed: I have personally reviewed following labs and imaging studies CBC: Recent Labs  Lab 06/11/24 1532 06/12/24 0308  WBC 19.9* 17.1*  NEUTROABS 16.1*  --   HGB 14.6 13.5  HCT 45.1 42.0  MCV 81.6 82.7  PLT 307 236   Basic Metabolic Panel: Recent Labs  Lab 06/11/24 1532 06/12/24 0308  NA 130* 133*  K 4.0 4.1  CL 101  108  CO2 21* 17*  GLUCOSE 91 81  BUN 32* 27*  CREATININE 2.84* 2.23*  CALCIUM 9.2 8.6*  MG  --  2.1  PHOS  --  3.5   GFR: Estimated Creatinine Clearance: 70.1 mL/min (A) (by C-G formula based on SCr of 2.23 mg/dL (H)). Liver Function Tests: Recent Labs  Lab 06/12/24 0308  AST 22  ALT 28  ALKPHOS 62  BILITOT 1.7*  PROT 7.7  ALBUMIN 3.4*   CBG: No results for input(s): GLUCAP in the last 168 hours.  Recent Results (from the past 240 hours)  Blood culture (routine x 2)     Status: None (Preliminary result)   Collection Time: 06/11/24  3:45 PM   Specimen: BLOOD  Result Value Ref Range Status   Specimen Description BLOOD LEFT ANTECUBITAL  Final   Special Requests   Final    BOTTLES DRAWN AEROBIC AND ANAEROBIC Blood Culture results may not be optimal due to an inadequate volume of blood received in culture bottles   Culture   Final    NO GROWTH < 24 HOURS Performed at [facility], [address] [place], [place] [address]    Report Status PENDING  Incomplete  Blood culture (routine x 2)     Status: None (Preliminary result)   Collection Time: 06/11/24  3:46 PM   Specimen: BLOOD  Result Value Ref Range Status   Specimen Description BLOOD RIGHT ANTECUBITAL  Final   Special Requests   Final    BOTTLES DRAWN AEROBIC AND ANAEROBIC Blood Culture results may not be optimal due to an inadequate volume of blood received in culture bottles   Culture   Final    NO GROWTH < 24 HOURS Performed at [facility], [address] [place], [place] [address]    Report Status PENDING  Incomplete     Radiology Studies: CT Renal Stone Study Result Date: 06/11/2024 CLINICAL DATA:  Left flank pain. EXAM: CT ABDOMEN AND PELVIS WITHOUT CONTRAST TECHNIQUE: Multidetector CT imaging of the abdomen and pelvis was performed following the standard protocol without IV contrast. RADIATION DOSE REDUCTION: This exam was performed according to the departmental dose-optimization  program which includes automated exposure control, adjustment of the mA and/or kV according to patient size and/or use of iterative reconstruction technique. COMPARISON:  06/15/2023. FINDINGS: Lower chest: No acute abnormality. Hepatobiliary: No suspicious focal hepatic lesion identified within the limits of an unenhanced exam. Gallbladder is unremarkable. No biliary dilatation. Pancreas: Unremarkable. No pancreatic ductal dilatation or surrounding inflammatory changes. Spleen: Spleen measures up to 16.2 cm in AP dimension, compared to 14 cm previously. Adrenals/Urinary Tract: Adrenal glands are unremarkable. Moderate left-sided hydroureteronephrosis with nonspecific left perinephric stranding. No left-sided renal or ureteral calculi identified. Left upper pole 2.2 cm fluid density cyst versus calyceal diverticulum is similar to slightly increased in size from the prior exam. Markedly atrophic right kidney. Mild-to-moderate prominence of the right ureter. No right renal or ureteral calculi. Bladder is mildly distended with right-sided suprapubic catheter in place. No bladder calculi identified. No abnormal fluid extending along the tract of the suprapubic catheter. Trace non dependent air within bladder likely relates to indwelling suprapubic catheter. Stomach/Bowel: Stomach is within normal limits. Appendix is not clearly identified. No pericecal inflammatory changes. Vascular/Lymphatic: No significant vascular findings are present. Mildly prominent left retroperitoneal lymph nodes, posterior to the left renal vein, measuring up to 9 mm in short axis (2:40), previously 7 mm. 8 mm left external iliac lymph node (2:81), previously 10 mm. Reproductive: Prostate is again enlarged measuring up to 5.4 cm in transverse dimension, similar to the prior exam. Other: No abdominopelvic ascites.  No intraperitoneal free air. Decreased size of a tubular fluid density structure extending from the junction of the anterior superior  right urinary bladder wall, just superior to the tract of the current suprapubic catheter, into the right anterior abdominal wall. This measures up to 1.6 cm in thickness on today's exam, previously 3 cm. 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 Musculoskeletal: No acute or significant osseous findings. IMPRESSION: 1. Moderate left-sided hydroureteronephrosis with nonspecific left perinephric stranding. No left-sided renal or ureteral calculi. These findings may be secondary to infection, a stone having recently passed, or chronic obstruction. 2. Markedly atrophic right kidney. Mild-to-moderate prominence of the right ureter. No right renal or ureteral calculi. 3. Decreased size of a tubular fluid density structure extending from the junction of the anterior superior right urinary bladder wall,  just superior to the tract of the current suprapubic catheter, into the right anterior abdominal wall. This measures up to 1.6 cm in thickness on today's exam, previously 3 cm. 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 4. Increased size of prominent left retroperitoneal lymph nodes measuring up to 9 mm, which may be reactive. Slightly decreased size of a mildly prominent left external iliac lymph node. 5. Enlarged prostate. 6. Splenomegaly. Electronically Signed   By: Harrietta Sherry M.D.   On: 06/11/2024 17:58    Scheduled Meds:  heparin  5,000 Units Subcutaneous Q8H   metoprolol  succinate  100 mg Oral Daily   Continuous Infusions:  sodium chloride  125 mL/hr at 06/12/24 0321   ceFEPime (MAXIPIME) IV 2 g (06/12/24  1006)     LOS: 1 day  MDM: Patient is high risk for one or more organ failure.  They necessitate ongoing hospitalization for continued IV therapies and subsequent lab monitoring. Total time spent interpreting labs and vitals, reviewing the medical record, coordinating care amongst consultants and care team members, directly assessing and discussing care with the patient and/or family: 55 min  Shiraz Bastyr, DO [facility]  To contact the attending physician between 7A-7P please use Epic Chat. To contact the covering physician during after hours 7P-7A, please review Amion.  06/12/2024, 4:26 PM   *This document has been created with the assistance of dictation software. Please excuse typographical errors. *

## 2024-06-13 ENCOUNTER — Encounter: Payer: Self-pay | Admitting: Family Medicine

## 2024-06-13 DIAGNOSIS — N179 Acute kidney failure, unspecified: Secondary | ICD-10-CM | POA: Diagnosis not present

## 2024-06-13 DIAGNOSIS — R6521 Severe sepsis with septic shock: Secondary | ICD-10-CM | POA: Diagnosis not present

## 2024-06-13 DIAGNOSIS — A419 Sepsis, unspecified organism: Secondary | ICD-10-CM | POA: Diagnosis not present

## 2024-06-13 LAB — CBC WITH DIFFERENTIAL/PLATELET
Abs Immature Granulocytes: 0.04 10*3/uL (ref 0.00–0.07)
Basophils Absolute: 0 10*3/uL (ref 0.0–0.1)
Basophils Relative: 0 %
Eosinophils Absolute: 0 10*3/uL (ref 0.0–0.5)
Eosinophils Relative: 0 %
HCT: 38.9 % — ABNORMAL LOW (ref 39.0–52.0)
Hemoglobin: 12.6 g/dL — ABNORMAL LOW (ref 13.0–17.0)
Immature Granulocytes: 1 %
Lymphocytes Relative: 20 %
Lymphs Abs: 1.6 10*3/uL (ref 0.7–4.0)
MCH: 26.4 pg (ref 26.0–34.0)
MCHC: 32.4 g/dL (ref 30.0–36.0)
MCV: 81.6 fL (ref 80.0–100.0)
Monocytes Absolute: 0.9 10*3/uL (ref 0.1–1.0)
Monocytes Relative: 11 %
Neutro Abs: 5.7 10*3/uL (ref 1.7–7.7)
Neutrophils Relative %: 68 %
Platelets: 238 10*3/uL (ref 150–400)
RBC: 4.77 MIL/uL (ref 4.22–5.81)
RDW: 14.3 % (ref 11.5–15.5)
WBC: 8.3 10*3/uL (ref 4.0–10.5)
nRBC: 0 % (ref 0.0–0.2)

## 2024-06-13 LAB — COMPREHENSIVE METABOLIC PANEL WITH GFR
ALT: 29 U/L (ref 0–44)
AST: 28 U/L (ref 15–41)
Albumin: 2.9 g/dL — ABNORMAL LOW (ref 3.5–5.0)
Alkaline Phosphatase: 63 U/L (ref 38–126)
Anion gap: 8 (ref 5–15)
BUN: 29 mg/dL — ABNORMAL HIGH (ref 6–20)
CO2: 20 mmol/L — ABNORMAL LOW (ref 22–32)
Calcium: 8.6 mg/dL — ABNORMAL LOW (ref 8.9–10.3)
Chloride: 108 mmol/L (ref 98–111)
Creatinine, Ser: 2.24 mg/dL — ABNORMAL HIGH (ref 0.61–1.24)
GFR, Estimated: 41 mL/min — ABNORMAL LOW (ref >60)
Glucose, Bld: 91 mg/dL (ref 70–99)
Potassium: 3.9 mmol/L (ref 3.5–5.1)
Sodium: 136 mmol/L (ref 135–145)
Total Bilirubin: 0.8 mg/dL (ref 0.0–1.2)
Total Protein: 6.9 g/dL (ref 6.5–8.1)

## 2024-06-13 LAB — PHOSPHORUS: Phosphorus: 2.9 mg/dL (ref 2.5–4.6)

## 2024-06-13 LAB — MAGNESIUM: Magnesium: 1.9 mg/dL (ref 1.7–2.4)

## 2024-06-13 MED ORDER — CHLORHEXIDINE GLUCONATE CLOTH 2 % EX PADS: 6.0000 | MEDICATED_PAD | Freq: Every day | CUTANEOUS | Status: DC

## 2024-06-13 NOTE — Progress Notes (Signed)
 PROGRESS NOTE    Austin Stevenson  FMW:[id number] DOB: 31-Jan-1996 DOA: 06/11/2024 PCP: Kristina Tinnie POUR, PA-C  Chief Complaint  Patient presents with   Hypotension    Hospital Course:   Austin Stevenson is a 28 y.o. 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  He was recently diagnosed with UTI outpatient and completed a course of Macrobid and ciprofloxacin  4 days prior.  He reports he has had decreased urinary output since that time. Reports last night began having fevers, nausea, vomiting, left sided flank pain, headache, rhinorrhea. Endorses dark colored urine.  In the ED patient is found to be septic, temperature 101.2, blood pressure 91/46, tachycardia to 124.  Labs reveal leukocytosis of 19.9, sodium 130, creatinine 2.84.  Patient was given 1 g ceftriaxone, 2 L LR.  Urine and blood culture sent. CT renal stone: Moderate left-sided hydroureteronephrosis with nonspecific left perinephric stranding, left upper pole 2.2 cm fluid density cyst versus calyceal diverticulum.  Markedly atrophic right kidney mild to moderate prominence of right ureter.  Right-sided suprapubic catheter in place, distended bladder.  No abnormal fluid extending along tract of suprapubic catheter.  Mildly prominent left retroperitoneal lymph nodes posterior to left renal vein.  Enlarged prostate up to 5.4 cm.  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  Subjective: No acute events overnight. On evaluation today patient has no complaints, less chills overnight, Flank pain resolving some.   Objective: Vitals:   06/13/24 0241 06/13/24 0734 06/13/24 0742 06/13/24 1544  BP: 121/71 122/70 122/70 118/76  Pulse: 86 91 91 95  Resp: 17 16 16 18   Temp: 98.5 F (36.9 C) 98.2 F (36.8 C) 98.2 F (36.8 C) 98.3 F (36.8 C)  TempSrc: Oral   Oral Oral  SpO2: 99% 99% 99% 98%  Weight:      Height:        Intake/Output Summary (Last 24 hours) at 06/13/2024 1646 Last data filed at 06/13/2024 1544 Gross per 24 hour  Intake 600 ml  Output 2600 ml  Net -2000 ml   Filed Weights   06/11/24 1420 06/11/24 1548  Weight: 133.8 kg 128.4 kg    Examination: General exam: Appears calm and comfortable, NAD  Respiratory system: No work of breathing, symmetric chest wall expansion Cardiovascular system: S1 & S2 heard, RRR.  Gastrointestinal system: Abdomen is nondistended, soft and nontender.  Neuro: Alert and oriented. No focal neurological deficits. Extremities: Symmetric, expected ROM Skin: No rashes, lesions Psychiatry: Demonstrates appropriate judgement and insight. Mood & affect appropriate for situation.   Assessment & Plan:  Principal Problem:   Sepsis (HCC)    Sepsis with septic shock, secondary to pyelonephritis, with associated AKI  multiple prior UTIs - Sepsis criteria: Leukocytosis, tachycardia, fever - Patiently recently completed course of Macrobid and ciprofloxacin .  Upon review of prior cultures patient has grown E. coli sensitive to ceftriaxone in the past.  Received 1 g ceftriaxone in the ED. - For now we will broaden to cefepime, high risk Pseudomonas given indwelling catheter and history of recurrent intermittent catheterization.  Will narrow per culture results - Urine culture currently growing E. coli, awaiting sensitivities. - Follow-up blood cultures, negative to date - Tolerating p.o.  Will discontinue further IV fluids - Hemoglobin A1c 5.2%   AKI superimposed on CKD stage IIIa Atrophic right kidney - Baseline creatinine appears to be near 1.5.  Creatinine  2.84 on arrival - Has been receiving fluid resuscitation, kidney function resolving now - Continue monitoring closely - Will consult nephrology if creatinine is not improving - Urine output remains robust - Continue to follow strict I's and O's -  Follows with [facility] DR Dominica.  Hyponatremia - Sodium 130 on arrival.  Status post 2 L LR - Now maintenance IV fluids with NS.  Improving.   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  Patient due for exchange.  We do not have button supplies in house.  Have catheterized with 36fr at patient request today.  He will exchange for mini button when he returns home. - Fistula seen on CT likely represents appendicovesicostomy   Hypertension -- Hypotensive on arrival secondary to sepsis. - Blood pressure still low/normal.  Continue holding lisinopril  - Have resumed home dose metoprolol  for tachycardia which has resolved  BMI 38 Morbid obesity - Outpatient follow up for lifestyle modification and risk factor management   Enlarged prostate - Incidentally seen on CT, outpatient follow-up   Splenomegaly - Incidentally seen on CT.  Platelet function within normal limits.  Will trend CBC  DVT prophylaxis: Heparin   Code Status: Full Code Disposition: Inpatient pending clinical resolution, currently on IV antibiotics  Consultants:    Procedures:    Antimicrobials:  Anti-infectives (From admission, onward)    Start     Dose/Rate Route Frequency Ordered Stop   06/11/24 2000  ceFEPIme (MAXIPIME) 2 g in sodium chloride  0.9 % 100 mL IVPB        2 g 200 mL/hr over 30 Minutes Intravenous 2 times daily 06/11/24 1817     06/11/24 1530  cefTRIAXone (ROCEPHIN) 1 g in sodium chloride  0.9 % 100 mL IVPB        1 g 200 mL/hr over 30 Minutes Intravenous  Once 06/11/24 1522 06/11/24 1627       Data Reviewed: I have personally reviewed following labs and imaging studies CBC: Recent Labs  Lab 06/11/24 1532 06/12/24 0308 06/13/24 0301  WBC 19.9* 17.1* 8.3  NEUTROABS 16.1*  --  5.7  HGB 14.6 13.5 12.6*  HCT 45.1 42.0 38.9*  MCV 81.6 82.7 81.6  PLT 307 236 238   Basic Metabolic  Panel: Recent Labs  Lab 06/11/24 1532 06/12/24 0308 06/13/24 0301 06/13/24 0302  NA 130* 133* 136  --   K 4.0 4.1 3.9  --   CL 101 108 108  --   CO2 21* 17* 20*  --   GLUCOSE 91 81 91  --   BUN 32* 27* 29*  --   CREATININE 2.84* 2.23* 2.24*  --   CALCIUM 9.2 8.6* 8.6*  --   MG  --  2.1  --  1.9  PHOS  --  3.5  --  2.9   GFR: Estimated Creatinine Clearance: 69.8 mL/min (A) (by C-G formula based on SCr of 2.24 mg/dL (H)). Liver Function Tests: Recent Labs  Lab 06/12/24 0308 06/13/24 0301  AST 22 28  ALT 28 29  ALKPHOS 62 63  BILITOT 1.7* 0.8  PROT 7.7 6.9  ALBUMIN 3.4* 2.9*   CBG: No results for input(s): GLUCAP in the last 168 hours.  Recent Results (from the past 240 hours)  Urine Culture     Status: Abnormal (Preliminary result)   Collection Time: 06/11/24  2:25 PM   Specimen: Urine, Catheterized  Result Value Ref Range Status   Specimen  Description   Final    URINE, CATHETERIZED Performed at [facility], [address] [place], [place] [address]    Special Requests   Final    NONE Performed at [facility], [address] [place], [place] [address]    Culture (A)  Final    >=100,000 COLONIES/mL ESCHERICHIA COLI SUSCEPTIBILITIES TO FOLLOW Performed at [facility], [address], [place], [place] [address]    Report Status PENDING  Incomplete  Blood culture (routine x 2)     Status: None (Preliminary result)   Collection Time: 06/11/24  3:45 PM   Specimen: BLOOD  Result Value Ref Range Status   Specimen Description BLOOD LEFT ANTECUBITAL  Final   Special Requests   Final    BOTTLES DRAWN AEROBIC AND ANAEROBIC Blood Culture results may not be optimal due to an inadequate volume of blood received in culture bottles   Culture   Final    NO GROWTH 2 DAYS Performed at [facility], [address] [place], [place] [address]    Report Status PENDING  Incomplete  Blood culture (routine x 2)     Status: None  (Preliminary result)   Collection Time: 06/11/24  3:46 PM   Specimen: BLOOD  Result Value Ref Range Status   Specimen Description BLOOD RIGHT ANTECUBITAL  Final   Special Requests   Final    BOTTLES DRAWN AEROBIC AND ANAEROBIC Blood Culture results may not be optimal due to an inadequate volume of blood received in culture bottles   Culture   Final    NO GROWTH 2 DAYS Performed at [facility], [address] [place], [place] [address]    Report Status PENDING  Incomplete     Radiology Studies: CT Renal Stone Study Result Date: 06/11/2024 CLINICAL DATA:  Left flank pain. EXAM: CT ABDOMEN AND PELVIS WITHOUT CONTRAST TECHNIQUE: Multidetector CT imaging of the abdomen and pelvis was performed following the standard protocol without IV contrast. RADIATION DOSE REDUCTION: This exam was performed according to the departmental dose-optimization program which includes automated exposure control, adjustment of the mA and/or kV according to patient size and/or use of iterative reconstruction technique. COMPARISON:  06/15/2023. FINDINGS: Lower chest: No acute abnormality. Hepatobiliary: No suspicious focal hepatic lesion identified within the limits of an unenhanced exam. Gallbladder is unremarkable. No biliary dilatation. Pancreas: Unremarkable. No pancreatic ductal dilatation or surrounding inflammatory changes. Spleen: Spleen measures up to 16.2 cm in AP dimension, compared to 14 cm previously. Adrenals/Urinary Tract: Adrenal glands are unremarkable. Moderate left-sided hydroureteronephrosis with nonspecific left perinephric stranding. No left-sided renal or ureteral calculi identified. Left upper pole 2.2 cm fluid density cyst versus calyceal diverticulum is similar to slightly increased in size from the prior exam. Markedly atrophic right kidney. Mild-to-moderate prominence of the right ureter. No right renal or ureteral calculi. Bladder is mildly distended with right-sided suprapubic catheter  in place. No bladder calculi identified. No abnormal fluid extending along the tract of the suprapubic catheter. Trace non dependent air within bladder likely relates to indwelling suprapubic catheter. Stomach/Bowel: Stomach is within normal limits. Appendix is not clearly identified. No pericecal inflammatory changes. Vascular/Lymphatic: No significant vascular findings are present. Mildly prominent left retroperitoneal lymph nodes, posterior to the left renal vein, measuring up to 9 mm in short axis (2:40), previously 7 mm. 8 mm left external iliac lymph node (2:81), previously 10 mm. Reproductive: Prostate is again enlarged measuring up to 5.4 cm in transverse dimension, similar to the  prior exam. Other: No abdominopelvic ascites.  No intraperitoneal free air. Decreased size of a tubular fluid density structure extending from the junction of the anterior superior right urinary bladder wall, just superior to the tract of the current suprapubic catheter, into the right anterior abdominal wall. This measures up to 1.6 cm in thickness on today's exam, previously 3 cm. 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 Musculoskeletal: No acute or significant osseous findings. IMPRESSION: 1. Moderate left-sided hydroureteronephrosis with nonspecific left perinephric stranding. No left-sided renal or ureteral calculi. These findings may be secondary to infection, a stone having recently passed, or chronic obstruction. 2. Markedly atrophic right kidney. Mild-to-moderate prominence of the right ureter. No right renal or ureteral calculi. 3. Decreased size of a tubular fluid density structure extending from the junction of the anterior superior right urinary bladder wall, just superior to the tract of the current suprapubic  catheter, into the right anterior abdominal wall. This measures up to 1.6 cm in thickness on today's exam, previously 3 cm. 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 4. Increased size of prominent left retroperitoneal lymph nodes measuring up to 9 mm, which may be reactive. Slightly decreased size of a mildly prominent left external iliac lymph node. 5. Enlarged prostate. 6. Splenomegaly. Electronically Signed   By: Harrietta Sherry M.D.   On: 06/11/2024 17:58    Scheduled Meds:  heparin  5,000 Units Subcutaneous Q8H   metoprolol  succinate  100 mg Oral Daily   Continuous Infusions:  ceFEPime (MAXIPIME) IV 2 g (06/13/24 1029)     LOS: 2 days  MDM: Patient is high risk for one or more organ failure.  They necessitate ongoing hospitalization for continued IV therapies and subsequent lab monitoring. Total time spent interpreting labs and vitals, reviewing the medical record, coordinating care amongst consultants and care team members, directly assessing and discussing care with the patient and/or family: 55 min  Neriah Brott, DO [facility]  To contact the attending physician between 7A-7P please use Epic Chat. To contact the covering physician during after hours 7P-7A, please review Amion.  06/13/2024, 4:46 PM   *This document has been created with the assistance of dictation software. Please excuse typographical errors. *

## 2024-06-13 NOTE — Progress Notes (Signed)
 Suprapubic button removed. 67F foley inserted using sterile technique. Draining clear yellow urine to bedside drainage.   Austin Stevenson

## 2024-06-13 NOTE — Plan of Care (Signed)

## 2024-06-14 ENCOUNTER — Other Ambulatory Visit: Payer: Self-pay

## 2024-06-14 DIAGNOSIS — A419 Sepsis, unspecified organism: Secondary | ICD-10-CM | POA: Diagnosis not present

## 2024-06-14 DIAGNOSIS — N179 Acute kidney failure, unspecified: Secondary | ICD-10-CM | POA: Diagnosis not present

## 2024-06-14 DIAGNOSIS — R6521 Severe sepsis with septic shock: Secondary | ICD-10-CM | POA: Diagnosis not present

## 2024-06-14 LAB — URINE CULTURE: Culture: >=100000 — AB

## 2024-06-14 LAB — COMPREHENSIVE METABOLIC PANEL WITH GFR
ALT: 50 U/L — ABNORMAL HIGH (ref 0–44)
AST: 50 U/L — ABNORMAL HIGH (ref 15–41)
Albumin: 3.1 g/dL — ABNORMAL LOW (ref 3.5–5.0)
Alkaline Phosphatase: 84 U/L (ref 38–126)
Anion gap: 9 (ref 5–15)
BUN: 23 mg/dL — ABNORMAL HIGH (ref 6–20)
CO2: 20 mmol/L — ABNORMAL LOW (ref 22–32)
Calcium: 8.9 mg/dL (ref 8.9–10.3)
Chloride: 109 mmol/L (ref 98–111)
Creatinine, Ser: 1.75 mg/dL — ABNORMAL HIGH (ref 0.61–1.24)
GFR, Estimated: 55 mL/min — ABNORMAL LOW (ref >60)
Glucose, Bld: 122 mg/dL — ABNORMAL HIGH (ref 70–99)
Potassium: 3.7 mmol/L (ref 3.5–5.1)
Sodium: 138 mmol/L (ref 135–145)
Total Bilirubin: 0.6 mg/dL (ref 0.0–1.2)
Total Protein: 7.7 g/dL (ref 6.5–8.1)

## 2024-06-14 LAB — MAGNESIUM: Magnesium: 1.9 mg/dL (ref 1.7–2.4)

## 2024-06-14 LAB — PHOSPHORUS: Phosphorus: 3.2 mg/dL (ref 2.5–4.6)

## 2024-06-14 MED ORDER — CEFADROXIL 500 MG PO CAPS
1000.0000 mg | ORAL_CAPSULE | Freq: Two times a day (BID) | ORAL | 0 refills | Status: AC
  Filled 2024-06-14: qty 16, 4d supply, fill #0

## 2024-06-14 NOTE — Plan of Care (Signed)

## 2024-06-14 NOTE — Progress Notes (Signed)
 Discharge education provided. IV's removed without complications. Patient is independent. Getting dressed.   Austin Stevenson V [name]

## 2024-06-14 NOTE — Discharge Summary (Signed)
 Physician Discharge Summary  Austin Stevenson FMW:[id number] DOB: Sep 07, 1996 DOA: 06/11/2024  PCP: Kristina Tinnie POUR, PA-C  Admit date: 06/11/2024 Discharge date: 06/14/2024   Recommendations for Outpatient Follow-up:  Follow up with PCP within 1 week to recheck your creatinine and ensure that it has returned to normal, review your blood pressure log for antihypertensive management Incidentally seen on CT, splenomegaly, prostatomegaly.  Hospital Course:  Austin Stevenson is a 28 y.o. 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  He was recently diagnosed with UTI outpatient and completed a course of Macrobid and ciprofloxacin  4 days prior.  He reports he has had decreased urinary output since that time. Reports last night began having fevers, nausea, vomiting, left sided flank pain, headache, rhinorrhea. Endorses dark colored urine.  In the ED patient is found to be septic, temperature 101.2, blood pressure 91/46, tachycardia to 124.  Labs reveal leukocytosis of 19.9, sodium 130, creatinine 2.84.  Patient was given 1 g ceftriaxone, 2 L LR.  Urine and blood culture sent. CT renal stone: Moderate left-sided hydroureteronephrosis with nonspecific left perinephric stranding, left upper pole 2.2 cm fluid density cyst versus calyceal diverticulum.  Markedly atrophic right kidney mild to moderate prominence of right ureter.  Right-sided suprapubic catheter in place, distended bladder.  No abnormal fluid extending along tract of suprapubic catheter.  Mildly prominent left retroperitoneal lymph nodes posterior to left renal vein.  Enlarged prostate up to 5.4 cm.  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 Patient was started on cefepime, urine culture grew E. coli with some resistance.  Leukocytosis and creatinine down trended.   Kidney function is returning towards normal, the patient should follow-up with his primary care physician or nephrologist within a week to repeat creatinine and ensure it has stabilized. Extended course of antibiotics were provided to patient at bedside prior to discharge.    Sepsis with septic shock, secondary to pyelonephritis, with associated AKI  multiple prior UTIs - Sepsis criteria: Leukocytosis, tachycardia, fever - Patiently recently completed course of Macrobid and ciprofloxacin .  Received 1 g ceftriaxone in the ED. - Status post cefepime.  Urine culture with E. coli: Resistant to fluoroquinolones.  Sent home with cefadroxil 500 twice daily for an additional 4 days to complete 1 week total therapy. - Blood cultures remain negative - Tolerating p.o.- Hemoglobin A1c 5.2%   AKI superimposed on CKD stage IIIa Atrophic right kidney - Baseline creatinine appears to be near 1.5.  Creatinine 2.84 on arrival - Has been receiving fluid resuscitation, kidney function resolving now - Creatinine downtrending though not quite back to normal.  Slow to resolve. - Repeat creatinine outpatient with PCP or nephrologist to ensure it has returned to normal - Strict I's and O's, urine output remains robust - Follows with [facility] Dr Dominica.   Hyponatremia resolved - 130 on arrival.  Resolved status post IV fluids.   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  Patient due for exchange.  We do not have button supplies in house.  Have catheterized with 37fr at patient request.  Will discharge home with this Foley.  He will exchange for mini button when he returns home. - Fistula seen on CT likely represents appendicovesicostomy   Hypertension -- Hypotensive on arrival secondary to sepsis. - Blood pressure still low/normal.  Continue holding  lisinopril .  Recommend blood pressure log  outpatient.  Review with PCP and determine if lisinopril  should be resumed.  May require at lower dose for renal function benefit. - Have resumed home dose metoprolol   BMI 38 Morbid obesity - Outpatient follow up for lifestyle modification and risk factor management   Enlarged prostate - Incidentally seen on CT, outpatient follow-up   Splenomegaly - Incidentally seen on CT.  Platelet function within normal limits.    Discharge Instructions  Discharge Instructions     Call MD for:  difficulty breathing, headache or visual disturbances   Complete by: As directed    Call MD for:  persistant dizziness or light-headedness   Complete by: As directed    Call MD for:  persistant nausea and vomiting   Complete by: As directed    Call MD for:  severe uncontrolled pain   Complete by: As directed    Call MD for:  temperature >100.4   Complete by: As directed    Diet general   Complete by: As directed    Discharge instructions   Complete by: As directed    Please follow-up with your primary care physician or nephrologist in 1 week to recheck your kidney function and ensure it has returned to normal.   Your blood pressure here was too low to tolerate your lisinopril  which was also held due to your kidney dysfunction.  Please take your blood pressure daily and keep a blood pressure log.  We recommend you follow-up with your primary care physician to review your blood pressure log outpatient to determine if you still require this medication.   Increase activity slowly   Complete by: As directed       Allergies as of 06/14/2024   No Known Allergies      Medication List     PAUSE taking these medications    lisinopril  20 MG tablet Wait to take this until your doctor or other care provider tells you to start again. Commonly known as: ZESTRIL  Take 1 tablet (20 mg total) by mouth daily.       STOP taking these medications    ciprofloxacin  500 MG tablet Commonly known as: CIPRO     nitrofurantoin (macrocrystal-monohydrate) 100 MG capsule Commonly known as: MACROBID   promethazine  12.5 MG tablet Commonly known as: PHENERGAN    sulfamethoxazole -trimethoprim  800-160 MG tablet Commonly known as: BACTRIM  DS       TAKE these medications    acetaminophen  500 MG tablet Commonly known as: TYLENOL  Take 500 mg by mouth every 6 (six) hours as needed.   cefadroxil 500 MG capsule Commonly known as: DURICEF Take 2 capsules (1,000 mg total) by mouth 2 (two) times daily for 4 days.   metoprolol  succinate 100 MG 24 hr tablet Commonly known as: TOPROL -XL Take 1 tablet (100 mg total) by mouth daily. Take with or immediately following a meal.        No Known Allergies  Consultations:    Procedures/Studies: CT Renal Stone Study Result Date: 06/11/2024 CLINICAL DATA:  Left flank pain. EXAM: CT ABDOMEN AND PELVIS WITHOUT CONTRAST TECHNIQUE: Multidetector CT imaging of the abdomen and pelvis was performed following the standard protocol without IV contrast. RADIATION DOSE REDUCTION: This exam was performed according to the departmental dose-optimization program which includes automated exposure control, adjustment of the mA and/or kV according to patient size and/or use of iterative reconstruction technique. COMPARISON:  06/15/2023. FINDINGS: Lower chest: No acute abnormality. Hepatobiliary: No suspicious focal hepatic lesion identified within the  limits of an unenhanced exam. Gallbladder is unremarkable. No biliary dilatation. Pancreas: Unremarkable. No pancreatic ductal dilatation or surrounding inflammatory changes. Spleen: Spleen measures up to 16.2 cm in AP dimension, compared to 14 cm previously. Adrenals/Urinary Tract: Adrenal glands are unremarkable. Moderate left-sided hydroureteronephrosis with nonspecific left perinephric stranding. No left-sided renal or ureteral calculi identified. Left upper pole 2.2 cm fluid density cyst versus calyceal diverticulum is similar to  slightly increased in size from the prior exam. Markedly atrophic right kidney. Mild-to-moderate prominence of the right ureter. No right renal or ureteral calculi. Bladder is mildly distended with right-sided suprapubic catheter in place. No bladder calculi identified. No abnormal fluid extending along the tract of the suprapubic catheter. Trace non dependent air within bladder likely relates to indwelling suprapubic catheter. Stomach/Bowel: Stomach is within normal limits. Appendix is not clearly identified. No pericecal inflammatory changes. Vascular/Lymphatic: No significant vascular findings are present. Mildly prominent left retroperitoneal lymph nodes, posterior to the left renal vein, measuring up to 9 mm in short axis (2:40), previously 7 mm. 8 mm left external iliac lymph node (2:81), previously 10 mm. Reproductive: Prostate is again enlarged measuring up to 5.4 cm in transverse dimension, similar to the prior exam. Other: No abdominopelvic ascites.  No intraperitoneal free air. Decreased size of a tubular fluid density structure extending from the junction of the anterior superior right urinary bladder wall, just superior to the tract of the current suprapubic catheter, into the right anterior abdominal wall. This measures up to 1.6 cm in thickness on today's exam, previously 3 cm. 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 Musculoskeletal: No acute or significant osseous findings. IMPRESSION: 1. Moderate left-sided hydroureteronephrosis with nonspecific left perinephric stranding. No left-sided renal or ureteral calculi. These findings may be secondary to infection, a stone having recently passed, or chronic obstruction. 2. Markedly atrophic right kidney. Mild-to-moderate prominence of the right ureter. No  right renal or ureteral calculi. 3. Decreased size of a tubular fluid density structure extending from the junction of the anterior superior right urinary bladder wall, just superior to the tract of the current suprapubic catheter, into the right anterior abdominal wall. This measures up to 1.6 cm in thickness on today's exam, previously 3 cm. 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 4. Increased size of prominent left retroperitoneal lymph nodes measuring up to 9 mm, which may be reactive. Slightly decreased size of a mildly prominent left external iliac lymph node. 5. Enlarged prostate. 6. Splenomegaly. Electronically Signed   By: Harrietta Sherry M.D.   On: 06/11/2024 17:58      Discharge Exam: Vitals:   06/14/24 0323 06/14/24 0730  BP: 111/76 116/67  Pulse: 75 76  Resp: 16 16  Temp: 98 F (36.7 C) 98.2 F (36.8 C)  SpO2: 93% 98%   Vitals:   06/13/24 1544 06/13/24 2031 06/14/24 0323 06/14/24 0730  BP: 118/76 111/62 111/76 116/67  Pulse: 95 92 75 76  Resp: 18 18 16 16   Temp: 98.3 F (36.8 C) 98.5 F (36.9 C) 98 F (36.7 C) 98.2 F (36.8 C)  TempSrc: Oral Oral  Oral  SpO2: 98% 98%  93% 98%  Weight:      Height:        Constitutional:  Normal appearance. Non toxic-appearing.  HENT: Head Normocephalic and atraumatic.  Mucous membranes are moist.  Eyes:  Extraocular intact. Conjunctivae normal.  Cardiovascular: Rate and Rhythm: Normal rate and regular rhythm.  Pulmonary: Non labored, symmetric rise of chest wall.  Skin: warm and dry. not jaundiced.  Neurological: No focal deficit present. alert. Oriented.  Psychiatric: Mood and Affect congruent.    The results of significant diagnostics from this hospitalization (including imaging, microbiology, ancillary and laboratory) are  listed below for reference.     Microbiology: Recent Results (from the past 240 hours)  Urine Culture     Status: Abnormal   Collection Time: 06/11/24  2:25 PM   Specimen: Urine, Catheterized  Result Value Ref Range Status   Specimen Description   Final    URINE, CATHETERIZED Performed at [facility], [address], [place], [place] [address]    Special Requests   Final    NONE Performed at [facility], [address] [place], [place] [address]    Culture >=100,000 COLONIES/mL ESCHERICHIA COLI (A)  Final   Report Status 06/14/2024 FINAL  Final   Organism ID, Bacteria ESCHERICHIA COLI (A)  Final      Susceptibility   Escherichia coli - MIC*    AMPICILLIN 8 SENSITIVE Sensitive     CEFAZOLIN <=4 SENSITIVE Sensitive     CEFEPIME <=0.12 SENSITIVE Sensitive     CEFTRIAXONE <=0.25 SENSITIVE Sensitive     CIPROFLOXACIN  >=4 RESISTANT Resistant     GENTAMICIN <=1 SENSITIVE Sensitive     IMIPENEM <=0.25 SENSITIVE Sensitive     NITROFURANTOIN 32 SENSITIVE Sensitive     TRIMETH /SULFA  <=20 SENSITIVE Sensitive     AMPICILLIN/SULBACTAM 4 SENSITIVE Sensitive     PIP/TAZO <=4 SENSITIVE Sensitive ug/mL    * >=100,000 COLONIES/mL ESCHERICHIA COLI  Blood culture (routine x 2)     Status: None (Preliminary result)   Collection Time: 06/11/24  3:45 PM   Specimen: BLOOD  Result Value Ref Range Status   Specimen Description BLOOD LEFT ANTECUBITAL  Final   Special Requests   Final    BOTTLES DRAWN AEROBIC AND ANAEROBIC Blood Culture results may not be optimal due to an inadequate volume of blood received in culture bottles   Culture   Final    NO GROWTH 3 DAYS Performed at [facility], [address] [place], [place] [address]    Report Status PENDING  Incomplete  Blood culture (routine x 2)     Status: None (Preliminary result)   Collection Time: 06/11/24  3:46 PM   Specimen: BLOOD  Result Value Ref Range Status   Specimen Description BLOOD RIGHT  ANTECUBITAL  Final   Special Requests   Final    BOTTLES DRAWN AEROBIC AND ANAEROBIC Blood Culture results may not be optimal due to an inadequate volume of blood received in culture bottles   Culture   Final    NO GROWTH 3 DAYS Performed at [facility], [address] [place], [place] [address]    Report Status PENDING  Incomplete     Labs: BNP (last 3 results) No results for input(s): BNP in the last 8760 hours. Basic Metabolic Panel: Recent Labs  Lab 06/11/24 1532 06/12/24 0308 06/13/24 0301 06/13/24 0302 06/14/24 0522 06/14/24 0954  NA 130* 133* 136  --   --  138  K 4.0 4.1 3.9  --   --  3.7  CL 101 108 108  --   --  109  CO2 21* 17* 20*  --   --  20*  GLUCOSE 91 81 91  --   --  122*  BUN 32* 27* 29*  --   --  23*  CREATININE 2.84* 2.23* 2.24*  --   --  1.75*  CALCIUM 9.2 8.6* 8.6*  --   --  8.9  MG  --  2.1  --  1.9 1.9  --   PHOS  --  3.5  --  2.9 3.2  --    Liver Function Tests: Recent Labs  Lab 06/12/24 0308 06/13/24 0301 06/14/24 0954  AST 22 28 50*  ALT 28 29 50*  ALKPHOS 62 63 84  BILITOT 1.7* 0.8 0.6  PROT 7.7 6.9 7.7  ALBUMIN 3.4* 2.9* 3.1*   No results for input(s): LIPASE, AMYLASE in the last 168 hours. No results for input(s): AMMONIA in the last 168 hours. CBC: Recent Labs  Lab 06/11/24 1532 06/12/24 0308 06/13/24 0301  WBC 19.9* 17.1* 8.3  NEUTROABS 16.1*  --  5.7  HGB 14.6 13.5 12.6*  HCT 45.1 42.0 38.9*  MCV 81.6 82.7 81.6  PLT 307 236 238   Cardiac Enzymes: No results for input(s): CKTOTAL, CKMB, CKMBINDEX, TROPONINI in the last 168 hours. BNP: Invalid input(s): POCBNP CBG: No results for input(s): GLUCAP in the last 168 hours. D-Dimer No results for input(s): DDIMER in the last 72 hours. Hgb A1c Recent Labs    06/11/24 1532  HGBA1C 5.2   Lipid Profile No results for input(s): CHOL, HDL, LDLCALC, TRIG, CHOLHDL, LDLDIRECT in the last 72 hours. Thyroid function studies No  results for input(s): TSH, T4TOTAL, T3FREE, THYROIDAB in the last 72 hours.  Invalid input(s): FREET3 Anemia work up No results for input(s): VITAMINB12, FOLATE, FERRITIN, TIBC, IRON, RETICCTPCT in the last 72 hours. Urinalysis    Component Value Date/Time   COLORURINE AMBER (A) 06/11/2024 1425   APPEARANCEUR CLOUDY (A) 06/11/2024 1425   APPEARANCEUR Turbid (A) 03/13/2023 1553   LABSPEC 1.015 06/11/2024 1425   PHURINE 5.0 06/11/2024 1425   GLUCOSEU NEGATIVE 06/11/2024 1425   HGBUR MODERATE (A) 06/11/2024 1425   BILIRUBINUR NEGATIVE 06/11/2024 1425   BILIRUBINUR negative 01/05/2024 0920   BILIRUBINUR Negative 03/13/2023 1553   KETONESUR NEGATIVE 06/11/2024 1425   PROTEINUR >=300 (A) 06/11/2024 1425   UROBILINOGEN 0.2 01/05/2024 0920   NITRITE NEGATIVE 06/11/2024 1425   LEUKOCYTESUR LARGE (A) 06/11/2024 1425   Sepsis Labs Recent Labs  Lab 06/11/24 1532 06/12/24 0308 06/13/24 0301  WBC 19.9* 17.1* 8.3   Microbiology Recent Results (from the past 240 hours)  Urine Culture     Status: Abnormal   Collection Time: 06/11/24  2:25 PM   Specimen: Urine, Catheterized  Result Value Ref Range Status   Specimen Description   Final    URINE, CATHETERIZED Performed at [facility], [address], [place], [place] [address]    Special Requests   Final    NONE Performed at [facility], [address] [place], [place] [address]    Culture >=100,000 COLONIES/mL ESCHERICHIA COLI (A)  Final   Report Status 06/14/2024 FINAL  Final   Organism ID, Bacteria ESCHERICHIA COLI (A)  Final      Susceptibility   Escherichia coli - MIC*    AMPICILLIN 8 SENSITIVE Sensitive     CEFAZOLIN <=4 SENSITIVE Sensitive     CEFEPIME <=0.12 SENSITIVE Sensitive  CEFTRIAXONE <=0.25 SENSITIVE Sensitive     CIPROFLOXACIN  >=4 RESISTANT Resistant     GENTAMICIN <=1 SENSITIVE Sensitive     IMIPENEM <=0.25 SENSITIVE Sensitive     NITROFURANTOIN 32 SENSITIVE  Sensitive     TRIMETH /SULFA  <=20 SENSITIVE Sensitive     AMPICILLIN/SULBACTAM 4 SENSITIVE Sensitive     PIP/TAZO <=4 SENSITIVE Sensitive ug/mL    * >=100,000 COLONIES/mL ESCHERICHIA COLI  Blood culture (routine x 2)     Status: None (Preliminary result)   Collection Time: 06/11/24  3:45 PM   Specimen: BLOOD  Result Value Ref Range Status   Specimen Description BLOOD LEFT ANTECUBITAL  Final   Special Requests   Final    BOTTLES DRAWN AEROBIC AND ANAEROBIC Blood Culture results may not be optimal due to an inadequate volume of blood received in culture bottles   Culture   Final    NO GROWTH 3 DAYS Performed at [facility], [address] [place], [place] [address]    Report Status PENDING  Incomplete  Blood culture (routine x 2)     Status: None (Preliminary result)   Collection Time: 06/11/24  3:46 PM   Specimen: BLOOD  Result Value Ref Range Status   Specimen Description BLOOD RIGHT ANTECUBITAL  Final   Special Requests   Final    BOTTLES DRAWN AEROBIC AND ANAEROBIC Blood Culture results may not be optimal due to an inadequate volume of blood received in culture bottles   Culture   Final    NO GROWTH 3 DAYS Performed at [facility], [address] [place], [place] [address]    Report Status PENDING  Incomplete     Time coordinating discharge: 32 min   SIGNED: Lorane Poland, MD  [facility] 06/14/2024, 11:45 AM Pager   If 7PM-7AM, please contact night-coverage

## 2024-06-16 LAB — CULTURE, BLOOD (ROUTINE X 2)
Culture: NO GROWTH
Culture: NO GROWTH

## 2024-06-28 ENCOUNTER — Ambulatory Visit: Payer: Medicaid Other | Admitting: Physician Assistant

## 2024-12-30 ENCOUNTER — Telehealth: Payer: Self-pay | Admitting: Physician Assistant

## 2024-12-30 NOTE — Telephone Encounter (Signed)
 Left vm and sent mychart message to confirm 01/06/25 appointment-Toni

## 2025-01-06 ENCOUNTER — Encounter: Payer: Medicaid Other | Admitting: Physician Assistant
